# Patient Record
Sex: Male | Born: 1940 | Race: White | Hispanic: No | Smoking: Former smoker
Health system: Southern US, Community
[De-identification: ages and names within clinical notes are randomized; demographics above are authoritative.]

## PROBLEM LIST (undated history)

## (undated) DIAGNOSIS — I2510 Atherosclerotic heart disease of native coronary artery without angina pectoris: Secondary | ICD-10-CM

## (undated) DIAGNOSIS — F0390 Unspecified dementia without behavioral disturbance: Secondary | ICD-10-CM

## (undated) DIAGNOSIS — C33 Malignant neoplasm of trachea: Secondary | ICD-10-CM

## (undated) DIAGNOSIS — I10 Essential (primary) hypertension: Secondary | ICD-10-CM

## (undated) DIAGNOSIS — C801 Malignant (primary) neoplasm, unspecified: Secondary | ICD-10-CM

## (undated) HISTORY — PX: CARDIAC SURGERY: SHX584

## (undated) HISTORY — PX: CORONARY ARTERY BYPASS GRAFT: SHX141

---

## 2013-09-03 ENCOUNTER — Other Ambulatory Visit: Payer: Self-pay

## 2013-09-03 ENCOUNTER — Emergency Department (HOSPITAL_COMMUNITY): Payer: Medicare PPO

## 2013-09-03 ENCOUNTER — Encounter (HOSPITAL_COMMUNITY): Payer: Self-pay | Admitting: Emergency Medicine

## 2013-09-03 ENCOUNTER — Emergency Department (HOSPITAL_COMMUNITY)
Admission: EM | Admit: 2013-09-03 | Discharge: 2013-09-03 | Disposition: A | Discharge status: Home / Self Care | Payer: Medicare PPO | Attending: Emergency Medicine | Admitting: Emergency Medicine

## 2013-09-03 DIAGNOSIS — C33 Malignant neoplasm of trachea: Secondary | ICD-10-CM | POA: Insufficient documentation

## 2013-09-03 DIAGNOSIS — Z87891 Personal history of nicotine dependence: Secondary | ICD-10-CM | POA: Insufficient documentation

## 2013-09-03 DIAGNOSIS — Z7982 Long term (current) use of aspirin: Secondary | ICD-10-CM | POA: Insufficient documentation

## 2013-09-03 DIAGNOSIS — R1310 Dysphagia, unspecified: Secondary | ICD-10-CM | POA: Insufficient documentation

## 2013-09-03 DIAGNOSIS — R0602 Shortness of breath: Secondary | ICD-10-CM | POA: Insufficient documentation

## 2013-09-03 DIAGNOSIS — I2510 Atherosclerotic heart disease of native coronary artery without angina pectoris: Secondary | ICD-10-CM | POA: Insufficient documentation

## 2013-09-03 DIAGNOSIS — Z79899 Other long term (current) drug therapy: Secondary | ICD-10-CM | POA: Insufficient documentation

## 2013-09-03 DIAGNOSIS — I10 Essential (primary) hypertension: Secondary | ICD-10-CM | POA: Insufficient documentation

## 2013-09-03 DIAGNOSIS — I4510 Unspecified right bundle-branch block: Secondary | ICD-10-CM | POA: Insufficient documentation

## 2013-09-03 HISTORY — DX: Malignant neoplasm of trachea: C33

## 2013-09-03 HISTORY — DX: Essential (primary) hypertension: I10

## 2013-09-03 HISTORY — DX: Atherosclerotic heart disease of native coronary artery without angina pectoris: I25.10

## 2013-09-03 HISTORY — DX: Malignant (primary) neoplasm, unspecified: C80.1

## 2013-09-03 LAB — CBC WITH DIFFERENTIAL/PLATELET
Eosinophils Absolute: 0 10*3/uL (ref 0.0–0.7)
Hemoglobin: 12.2 g/dL — ABNORMAL LOW (ref 13.0–17.0)
Lymphocytes Relative: 8 % — ABNORMAL LOW (ref 12–46)
Lymphs Abs: 0.3 10*3/uL — ABNORMAL LOW (ref 0.7–4.0)
MCH: 32.3 pg (ref 26.0–34.0)
Monocytes Relative: 13 % — ABNORMAL HIGH (ref 3–12)
Neutro Abs: 3.1 10*3/uL (ref 1.7–7.7)
Neutrophils Relative %: 79 % — ABNORMAL HIGH (ref 43–77)
Platelets: 124 10*3/uL — ABNORMAL LOW (ref 150–400)
RBC: 3.78 MIL/uL — ABNORMAL LOW (ref 4.22–5.81)
RDW: 12.7 % (ref 11.5–15.5)
WBC: 3.9 10*3/uL — ABNORMAL LOW (ref 4.0–10.5)

## 2013-09-03 LAB — BASIC METABOLIC PANEL
CO2: 25 mEq/L (ref 19–32)
Chloride: 102 mEq/L (ref 96–112)
GFR calc non Af Amer: 89 mL/min — ABNORMAL LOW (ref >90)
Glucose, Bld: 92 mg/dL (ref 70–99)
Potassium: 3.5 mEq/L (ref 3.5–5.1)
Sodium: 137 mEq/L (ref 135–145)

## 2013-09-03 LAB — TROPONIN I
Troponin I: <0.3 ng/mL (ref <0.30)
Troponin I: <0.3 ng/mL (ref <0.30)

## 2013-09-03 MED ORDER — IOHEXOL 350 MG/ML SOLN
80.0000 mL | Freq: Once | INTRAVENOUS | Status: AC | PRN
  Administered 2013-09-03: 80 mL via INTRAVENOUS

## 2013-09-03 NOTE — ED Provider Notes (Signed)
Medical screening examination/treatment/procedure(s) were conducted as a shared visit with non-physician practitioner(s) and myself.  I personally evaluated the patient during the encounter.  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 No chest pain, fever, cough. Able to swallow but has discomfort related to tumor. CTAB, RRR, speaking in full sentences. Tolerating PO but Needs [facility] swallow evaluation.  Start PPI.  EKG Interpretation   None        Glynn Octave, MD 09/03/13 [phone]

## 2013-09-03 NOTE — ED Notes (Addendum)
Pt from home via [facility] with c/o shortness of breath x2 -3 days increasing last night.  Pt is being treated for tracheal cancer, both chemo and radiation at the [place] in [place] starting the month of Dec.  Pt in NAD, A&O.

## 2013-09-03 NOTE — ED Provider Notes (Signed)
Date: 09/03/2013  Rate: 88  Rhythm: normal sinus rhythm  QRS Axis: normal  Intervals: normal  ST/T Wave abnormalities: normal  Conduction Disutrbances:RBBB  Narrative Interpretation:   Old EKG Reviewed: none available    Glynn Octave, MD 09/03/13 [phone]

## 2013-09-03 NOTE — ED Provider Notes (Signed)
CSN: [id number]     Arrival date & time 09/03/13  0845 History   First MD Initiated Contact with Patient 09/03/13 [phone]     Chief Complaint  Patient presents with  . Shortness of Breath   (Consider location/radiation/quality/duration/timing/severity/associated sxs/prior Treatment) HPI  [facility]  PMH: 1. tracheal cancer- doing radiation (Dec 23 last treatment) and chemotherapy 2. Cardiac surgery 3. CAD 4. hypertension 5. dysphagia  Patient presents to the ED with complaints of shortness of breath over the past three days without pain. He says that he has been having difficulties swallowing due to the mass effect on his esophagus but he has never experienced the shortness of breath before. He describes it as difficulty taking in a decent breath. He denies chest pains, headache, weakness, lower extremity swelling, abdominal distention. He has been eating and drinking normally. He is speaking in full sentences. And his vital signs are stable.   Past Medical History  Diagnosis Date  . Cancer   . Tracheal cancer   . Coronary artery disease   . Hypertension    Past Surgical History  Procedure Laterality Date  . Cardiac surgery    . Coronary artery bypass graft     History reviewed. No pertinent family history. History  Substance Use Topics  . Smoking status: Former Smoker    Types: Cigarettes  . Smokeless tobacco: Never Used  . Alcohol Use: 1.2 oz/week    2 Cans of beer per week    Review of Systems The patient denies anorexia, fever, weight loss, vision loss, decreased hearing, hoarseness, chest pain, syncope, , peripheral edema, balance deficits, hemoptysis, abdominal pain, melena, hematochezia, severe indigestion/heartburn, hematuria, incontinence, genital sores, muscle weakness, suspicious skin lesions, transient blindness, difficulty walking, depression, unusual weight change, abnormal bleeding, enlarged lymph nodes, angioedema, and breast  masses.  Allergies  Review of patient's allergies indicates no known allergies.  Home Medications   Current Outpatient Rx  Name  Route  Sig  Dispense  Refill  . aspirin 325 MG EC tablet   Oral   Take 325 mg by mouth daily.         . cholecalciferol (VITAMIN D) 1000 UNITS tablet   Oral   Take 1,000 Units by mouth every morning.         . folic acid (FOLVITE) 400 MCG tablet   Oral   Take 400 mcg by mouth every morning.         . lidocaine (XYLOCAINE) 2 % solution   Mouth/Throat   Use as directed 20 mLs in the mouth or throat daily as needed for mouth pain.         Marland Kitchen lisinopril (PRINIVIL,ZESTRIL) 10 MG tablet   Oral   Take 10 mg by mouth daily.         Marland Kitchen loratadine (CLARITIN) 10 MG tablet   Oral   Take 10 mg by mouth every morning.         . metoprolol (LOPRESSOR) 50 MG tablet   Oral   Take 50 mg by mouth every evening.         . Multiple Vitamin (MULTIVITAMIN WITH MINERALS) TABS tablet   Oral   Take 1 tablet by mouth every morning.         Marland Kitchen oxyCODONE (OXY IR/ROXICODONE) 5 MG immediate release tablet   Oral   Take 5 mg by mouth every 4 (four) hours as needed for severe pain.         Marland Kitchen oxymetazoline (  AFRIN) 0.05 % nasal spray   Each Nare   Place 1 spray into both nostrils 2 (two) times daily.         . probenecid (BENEMID) 500 MG tablet   Oral   Take 500 mg by mouth every morning.         . simvastatin (ZOCOR) 80 MG tablet   Oral   Take 80 mg by mouth every evening.         . sucralfate (CARAFATE) 1 G tablet   Oral   Take 1 g by mouth 4 (four) times daily -  with meals and at bedtime.          BP 139/61  Pulse 72  Temp(Src) 98.1 F (36.7 C) (Oral)  Resp 20  SpO2 98% Physical Exam  Nursing note and vitals reviewed. Constitutional: He appears well-developed and well-nourished. No distress.  HENT:  Head: Normocephalic and atraumatic.  Eyes: Pupils are equal, round, and reactive to light.  Neck: Normal range of motion. Neck  supple.  Cardiovascular: Normal rate and regular rhythm.   Pulmonary/Chest: Effort normal.  Abdominal: Soft.  Neurological: He is alert.  Skin: Skin is warm and dry.    ED Course  Procedures (including critical care time) Labs Review Labs Reviewed  CBC WITH DIFFERENTIAL - Abnormal; Notable for the following:    WBC 3.9 (*)    RBC 3.78 (*)    Hemoglobin 12.2 (*)    HCT 34.5 (*)    Platelets 124 (*)    Neutrophils Relative % 79 (*)    Lymphocytes Relative 8 (*)    Lymphs Abs 0.3 (*)    Monocytes Relative 13 (*)    All other components within normal limits  BASIC METABOLIC PANEL - Abnormal; Notable for the following:    GFR calc non Af Amer 89 (*)    All other components within normal limits  PRO B NATRIURETIC PEPTIDE - Abnormal; Notable for the following:    Pro B Natriuretic peptide (BNP) 139.3 (*)    All other components within normal limits  TROPONIN I  TROPONIN I   Imaging Review Dg Chest 2 View  09/03/2013   CLINICAL DATA:  Shortness of Breath  EXAM: CHEST  2 VIEW  COMPARISON:  None.  FINDINGS: There is mild scarring in the left base. Lungs are otherwise clear. Heart size and pulmonary vascularity are normal. No adenopathy. There is degenerative change in the thoracic spine. Patient is status post coronary artery bypass grafting. There is extensive native coronary artery calcification.  IMPRESSION: Mild scarring left base.  No edema or consolidation.   Electronically Signed   By: Bretta Bang M.D.   On: 09/03/2013 09:56   Ct Angio Chest Pe W/cm &/or Wo Cm  09/03/2013   CLINICAL DATA:  Shortness of Breath ; reported tracheal carcinoma  EXAM: CT ANGIOGRAPHY CHEST WITH CONTRAST  TECHNIQUE: Multidetector CT imaging of the chest was performed using the standard protocol during bolus administration of intravenous contrast. Multiplanar CT image reconstructions including MIPs were obtained to evaluate the vascular anatomy.  CONTRAST:  80mL OMNIPAQUE IOHEXOL 350 MG/ML SOLN   COMPARISON:  Chest radiograph September 03, 2013  FINDINGS: There is no demonstrable pulmonary embolus. There is no thoracic aortic aneurysm or dissection apparent.  There is underlying emphysematous change. There is patchy atelectatic change in both lower lobes. There is mild lower lobe bronchiectatic change. There are multiple peripheral 1-2 mm nodular opacities consistent with a degree of mild underlying nodular  interstitial disease. No larger nodular opacities are appreciated. There is no airspace consolidation.  There is no appreciable thoracic adenopathy. The pericardium is not thickened. Patient is status post coronary artery bypass grafting. There is extensive native coronary artery calcification.  There is diffuse thickening of the wall of the inferior trachea. No well-defined mass is seen in this area. There is extensive thickening throughout most of the esophagus.  In the visualized upper abdomen, there is fatty change in the liver. There is incomplete visualization of a cyst arising from the posterior left kidney measuring 7.6 x 6.6 cm. There is atherosclerotic change in the aorta. There is degenerative change in the thoracic spine. There are no blastic or lytic bone lesions. Thyroid appears normal.  Review of the MIP images confirms the above findings.  IMPRESSION: No demonstrable pulmonary embolus.  There is underlying emphysema with mild nodular interstitial lung disease and lower lobe bronchiectatic change. There is no airspace consolidation.  No adenopathy.  Thickening of the wall of the inferior trachea; this finding may be due to reported radiation therapy in this area. Infiltrative neoplasm in this area cannot be excluded on this study, however.  There is diffuse esophageal thickening. This finding may be due to therapy from known tracheal carcinoma; underlying esophagitis must be of concern given this appearance.   Electronically Signed   By: Bretta Bang M.D.   On: 09/03/2013 11:13     EKG Interpretation   None       MDM   1. Shortness of breath       Date: 09/03/2013  Rate: 88  Rhythm:  sinus rhythm  QRS Axis: right bundle branch block  Intervals: normal  ST/T Wave abnormalities: normal  Conduction Disutrbances:none  Narrative Interpretation:   Old EKG Reviewed: none available   Patient evaluated by Dr. Manus Gunning myself. He has had a very thorough workup in the emergency department which includes a troponin (2), chest x-ray, CT angiography chest, EKG, basic labs. No acute findings are being found. He has maintained his oxygen saturation on room air and has been in no distress during his visit. Dr. Manus Gunning suggested getting a dull to troponin which has come back negative.  [facility] and is [facility] much better. No findings that warrant admission or further evaluation at this time.  72 y.o.Gerome Sam Rosendahl's evaluation in the Emergency Department is complete. It has been determined that no acute conditions requiring further emergency intervention are present at this time. The patient/guardian have been advised of the diagnosis and plan. We have discussed signs and symptoms that warrant return to the ED, such as changes or worsening in symptoms.  Vital signs are stable at discharge. Filed Vitals:   09/03/13 1204  BP: 139/61  Pulse: 72  Temp:   Resp:     Patient/guardian has voiced understanding and agreed to follow-up with the PCP or specialist.        Dorthula Matas, PA-C 09/03/13 1338

## 2014-09-08 DIAGNOSIS — F0390 Unspecified dementia without behavioral disturbance: Secondary | ICD-10-CM

## 2014-09-08 HISTORY — DX: Unspecified dementia, unspecified severity, without behavioral disturbance, psychotic disturbance, mood disturbance, and anxiety: F03.90

## 2018-03-06 ENCOUNTER — Inpatient Hospital Stay (HOSPITAL_COMMUNITY)
Admission: EM | Admit: 2018-03-06 | Discharge: 2018-03-15 | DRG: 871 | Disposition: A | Discharge status: Hospice | Payer: Medicare PPO | Source: Skilled Nursing Facility | Attending: Internal Medicine | Admitting: Internal Medicine

## 2018-03-06 ENCOUNTER — Emergency Department (HOSPITAL_COMMUNITY): Payer: Medicare PPO

## 2018-03-06 ENCOUNTER — Encounter (HOSPITAL_COMMUNITY): Payer: Self-pay | Admitting: Emergency Medicine

## 2018-03-06 ENCOUNTER — Inpatient Hospital Stay (HOSPITAL_COMMUNITY): Payer: Medicare PPO

## 2018-03-06 DIAGNOSIS — R6520 Severe sepsis without septic shock: Secondary | ICD-10-CM | POA: Diagnosis present

## 2018-03-06 DIAGNOSIS — Z923 Personal history of irradiation: Secondary | ICD-10-CM

## 2018-03-06 DIAGNOSIS — N390 Urinary tract infection, site not specified: Secondary | ICD-10-CM | POA: Diagnosis present

## 2018-03-06 DIAGNOSIS — N433 Hydrocele, unspecified: Secondary | ICD-10-CM | POA: Diagnosis present

## 2018-03-06 DIAGNOSIS — A0472 Enterocolitis due to Clostridium difficile, not specified as recurrent: Secondary | ICD-10-CM | POA: Diagnosis present

## 2018-03-06 DIAGNOSIS — Z66 Do not resuscitate: Secondary | ICD-10-CM | POA: Diagnosis present

## 2018-03-06 DIAGNOSIS — G309 Alzheimer's disease, unspecified: Secondary | ICD-10-CM | POA: Diagnosis present

## 2018-03-06 DIAGNOSIS — N139 Obstructive and reflux uropathy, unspecified: Secondary | ICD-10-CM

## 2018-03-06 DIAGNOSIS — A419 Sepsis, unspecified organism: Secondary | ICD-10-CM | POA: Diagnosis present

## 2018-03-06 DIAGNOSIS — I714 Abdominal aortic aneurysm, without rupture: Secondary | ICD-10-CM | POA: Diagnosis present

## 2018-03-06 DIAGNOSIS — R1310 Dysphagia, unspecified: Secondary | ICD-10-CM | POA: Diagnosis present

## 2018-03-06 DIAGNOSIS — G9349 Other encephalopathy: Secondary | ICD-10-CM | POA: Diagnosis present

## 2018-03-06 DIAGNOSIS — S00412A Abrasion of left ear, initial encounter: Secondary | ICD-10-CM | POA: Diagnosis present

## 2018-03-06 DIAGNOSIS — Z79899 Other long term (current) drug therapy: Secondary | ICD-10-CM

## 2018-03-06 DIAGNOSIS — L89152 Pressure ulcer of sacral region, stage 2: Secondary | ICD-10-CM | POA: Diagnosis not present

## 2018-03-06 DIAGNOSIS — R296 Repeated falls: Secondary | ICD-10-CM | POA: Diagnosis present

## 2018-03-06 DIAGNOSIS — H9190 Unspecified hearing loss, unspecified ear: Secondary | ICD-10-CM | POA: Diagnosis present

## 2018-03-06 DIAGNOSIS — Z23 Encounter for immunization: Secondary | ICD-10-CM | POA: Diagnosis present

## 2018-03-06 DIAGNOSIS — A414 Sepsis due to anaerobes: Principal | ICD-10-CM | POA: Diagnosis present

## 2018-03-06 DIAGNOSIS — N138 Other obstructive and reflux uropathy: Secondary | ICD-10-CM | POA: Diagnosis present

## 2018-03-06 DIAGNOSIS — S0003XA Contusion of scalp, initial encounter: Secondary | ICD-10-CM | POA: Diagnosis present

## 2018-03-06 DIAGNOSIS — E43 Unspecified severe protein-calorie malnutrition: Secondary | ICD-10-CM | POA: Diagnosis present

## 2018-03-06 DIAGNOSIS — Z951 Presence of aortocoronary bypass graft: Secondary | ICD-10-CM | POA: Diagnosis not present

## 2018-03-06 DIAGNOSIS — F0281 Dementia in other diseases classified elsewhere with behavioral disturbance: Secondary | ICD-10-CM | POA: Diagnosis present

## 2018-03-06 DIAGNOSIS — E878 Other disorders of electrolyte and fluid balance, not elsewhere classified: Secondary | ICD-10-CM | POA: Diagnosis not present

## 2018-03-06 DIAGNOSIS — L8990 Pressure ulcer of unspecified site, unspecified stage: Secondary | ICD-10-CM

## 2018-03-06 DIAGNOSIS — N2589 Other disorders resulting from impaired renal tubular function: Secondary | ICD-10-CM | POA: Diagnosis not present

## 2018-03-06 DIAGNOSIS — Z87440 Personal history of urinary (tract) infections: Secondary | ICD-10-CM

## 2018-03-06 DIAGNOSIS — I10 Essential (primary) hypertension: Secondary | ICD-10-CM | POA: Diagnosis present

## 2018-03-06 DIAGNOSIS — R338 Other retention of urine: Secondary | ICD-10-CM | POA: Diagnosis not present

## 2018-03-06 DIAGNOSIS — N179 Acute kidney failure, unspecified: Secondary | ICD-10-CM | POA: Diagnosis present

## 2018-03-06 DIAGNOSIS — E8809 Other disorders of plasma-protein metabolism, not elsewhere classified: Secondary | ICD-10-CM | POA: Diagnosis not present

## 2018-03-06 DIAGNOSIS — N320 Bladder-neck obstruction: Secondary | ICD-10-CM | POA: Diagnosis present

## 2018-03-06 DIAGNOSIS — I2510 Atherosclerotic heart disease of native coronary artery without angina pectoris: Secondary | ICD-10-CM | POA: Diagnosis present

## 2018-03-06 DIAGNOSIS — F0391 Unspecified dementia with behavioral disturbance: Secondary | ICD-10-CM | POA: Diagnosis not present

## 2018-03-06 DIAGNOSIS — Z79890 Hormone replacement therapy (postmenopausal): Secondary | ICD-10-CM

## 2018-03-06 DIAGNOSIS — E872 Acidosis: Secondary | ICD-10-CM | POA: Diagnosis present

## 2018-03-06 DIAGNOSIS — M545 Low back pain: Secondary | ICD-10-CM | POA: Diagnosis present

## 2018-03-06 DIAGNOSIS — I4510 Unspecified right bundle-branch block: Secondary | ICD-10-CM | POA: Diagnosis present

## 2018-03-06 DIAGNOSIS — R627 Adult failure to thrive: Secondary | ICD-10-CM | POA: Diagnosis present

## 2018-03-06 DIAGNOSIS — G9340 Encephalopathy, unspecified: Secondary | ICD-10-CM

## 2018-03-06 DIAGNOSIS — Y92009 Unspecified place in unspecified non-institutional (private) residence as the place of occurrence of the external cause: Secondary | ICD-10-CM

## 2018-03-06 DIAGNOSIS — Z9221 Personal history of antineoplastic chemotherapy: Secondary | ICD-10-CM | POA: Diagnosis not present

## 2018-03-06 DIAGNOSIS — N2889 Other specified disorders of kidney and ureter: Secondary | ICD-10-CM | POA: Diagnosis not present

## 2018-03-06 DIAGNOSIS — N289 Disorder of kidney and ureter, unspecified: Secondary | ICD-10-CM | POA: Diagnosis present

## 2018-03-06 DIAGNOSIS — Z8512 Personal history of malignant neoplasm of trachea: Secondary | ICD-10-CM | POA: Diagnosis not present

## 2018-03-06 DIAGNOSIS — K4090 Unilateral inguinal hernia, without obstruction or gangrene, not specified as recurrent: Secondary | ICD-10-CM | POA: Diagnosis present

## 2018-03-06 DIAGNOSIS — N401 Enlarged prostate with lower urinary tract symptoms: Secondary | ICD-10-CM | POA: Diagnosis present

## 2018-03-06 DIAGNOSIS — Z974 Presence of external hearing-aid: Secondary | ICD-10-CM | POA: Diagnosis not present

## 2018-03-06 DIAGNOSIS — R451 Restlessness and agitation: Secondary | ICD-10-CM | POA: Diagnosis not present

## 2018-03-06 DIAGNOSIS — W19XXXA Unspecified fall, initial encounter: Secondary | ICD-10-CM | POA: Diagnosis not present

## 2018-03-06 DIAGNOSIS — I959 Hypotension, unspecified: Secondary | ICD-10-CM | POA: Diagnosis not present

## 2018-03-06 DIAGNOSIS — F05 Delirium due to known physiological condition: Secondary | ICD-10-CM | POA: Diagnosis not present

## 2018-03-06 DIAGNOSIS — N5089 Other specified disorders of the male genital organs: Secondary | ICD-10-CM | POA: Diagnosis present

## 2018-03-06 DIAGNOSIS — D72829 Elevated white blood cell count, unspecified: Secondary | ICD-10-CM | POA: Diagnosis not present

## 2018-03-06 DIAGNOSIS — Z515 Encounter for palliative care: Secondary | ICD-10-CM | POA: Diagnosis not present

## 2018-03-06 DIAGNOSIS — Z681 Body mass index (BMI) 19 or less, adult: Secondary | ICD-10-CM | POA: Diagnosis not present

## 2018-03-06 DIAGNOSIS — Z7982 Long term (current) use of aspirin: Secondary | ICD-10-CM

## 2018-03-06 DIAGNOSIS — L89151 Pressure ulcer of sacral region, stage 1: Secondary | ICD-10-CM | POA: Diagnosis present

## 2018-03-06 DIAGNOSIS — Z87891 Personal history of nicotine dependence: Secondary | ICD-10-CM

## 2018-03-06 DIAGNOSIS — R339 Retention of urine, unspecified: Secondary | ICD-10-CM | POA: Diagnosis present

## 2018-03-06 DIAGNOSIS — Z960 Presence of urogenital implants: Secondary | ICD-10-CM | POA: Diagnosis not present

## 2018-03-06 DIAGNOSIS — F03918 Unspecified dementia, unspecified severity, with other behavioral disturbance: Secondary | ICD-10-CM

## 2018-03-06 HISTORY — DX: Unspecified dementia without behavioral disturbance: F03.90

## 2018-03-06 LAB — URINALYSIS, ROUTINE W REFLEX MICROSCOPIC
BACTERIA UA: NONE SEEN
BILIRUBIN URINE: NEGATIVE
GLUCOSE, UA: NEGATIVE mg/dL
KETONES UR: NEGATIVE mg/dL
LEUKOCYTES UA: NEGATIVE
NITRITE: NEGATIVE
Protein, ur: NEGATIVE mg/dL
Specific Gravity, Urine: 1.02 (ref 1.005–1.030)
pH: 5 (ref 5.0–8.0)

## 2018-03-06 LAB — OSMOLALITY: OSMOLALITY: 288 mosm/kg (ref 275–295)

## 2018-03-06 LAB — I-STAT CG4 LACTIC ACID, ED: Lactic Acid, Venous: 1.81 mmol/L (ref 0.5–1.9)

## 2018-03-06 LAB — PROTIME-INR
INR: 1.12
PROTHROMBIN TIME: 14.4 s (ref 11.4–15.2)

## 2018-03-06 LAB — COMPREHENSIVE METABOLIC PANEL
ALK PHOS: 65 U/L (ref 38–126)
ALT: 14 U/L (ref 0–44)
ANION GAP: 10 (ref 5–15)
AST: 21 U/L (ref 15–41)
Albumin: 1.8 g/dL — ABNORMAL LOW (ref 3.5–5.0)
BILIRUBIN TOTAL: 0.6 mg/dL (ref 0.3–1.2)
BUN: 34 mg/dL — ABNORMAL HIGH (ref 8–23)
CALCIUM: 7.2 mg/dL — AB (ref 8.9–10.3)
CO2: 24 mmol/L (ref 22–32)
Chloride: 99 mmol/L (ref 98–111)
Creatinine, Ser: 1.81 mg/dL — ABNORMAL HIGH (ref 0.61–1.24)
GFR, EST AFRICAN AMERICAN: 39 mL/min — AB (ref >60)
GFR, EST NON AFRICAN AMERICAN: 34 mL/min — AB (ref >60)
GLUCOSE: 104 mg/dL — AB (ref 70–99)
Potassium: 3.7 mmol/L (ref 3.5–5.1)
Sodium: 133 mmol/L — ABNORMAL LOW (ref 135–145)
TOTAL PROTEIN: 4 g/dL — AB (ref 6.5–8.1)

## 2018-03-06 LAB — CBC WITH DIFFERENTIAL/PLATELET
BASOS PCT: 0 %
Band Neutrophils: 0 %
Basophils Absolute: 0 10*3/uL (ref 0.0–0.1)
Blasts: 0 %
EOS ABS: 0 10*3/uL (ref 0.0–0.7)
EOS PCT: 0 %
HEMATOCRIT: 31.8 % — AB (ref 39.0–52.0)
HEMOGLOBIN: 10 g/dL — AB (ref 13.0–17.0)
LYMPHS PCT: 3 %
Lymphs Abs: 1 10*3/uL (ref 0.7–4.0)
MCH: 28.2 pg (ref 26.0–34.0)
MCHC: 31.4 g/dL (ref 30.0–36.0)
MCV: 89.6 fL (ref 78.0–100.0)
Metamyelocytes Relative: 0 %
Monocytes Absolute: 2.2 10*3/uL — ABNORMAL HIGH (ref 0.1–1.0)
Monocytes Relative: 7 %
Myelocytes: 0 %
NEUTROS PCT: 90 %
NRBC: 0 /100{WBCs}
Neutro Abs: 28.6 10*3/uL — ABNORMAL HIGH (ref 1.7–7.7)
Other: 0 %
Platelets: 335 10*3/uL (ref 150–400)
Promyelocytes Relative: 0 %
RBC: 3.55 MIL/uL — ABNORMAL LOW (ref 4.22–5.81)
RDW: 17.6 % — ABNORMAL HIGH (ref 11.5–15.5)
WBC: 31.8 10*3/uL — ABNORMAL HIGH (ref 4.0–10.5)

## 2018-03-06 LAB — I-STAT CHEM 8, ED
BUN: 33 mg/dL — AB (ref 8–23)
CHLORIDE: 95 mmol/L — AB (ref 98–111)
CREATININE: 2 mg/dL — AB (ref 0.61–1.24)
Calcium, Ion: 1.08 mmol/L — ABNORMAL LOW (ref 1.15–1.40)
Glucose, Bld: 117 mg/dL — ABNORMAL HIGH (ref 70–99)
HEMATOCRIT: 31 % — AB (ref 39.0–52.0)
Hemoglobin: 10.5 g/dL — ABNORMAL LOW (ref 13.0–17.0)
Potassium: 3.9 mmol/L (ref 3.5–5.1)
SODIUM: 131 mmol/L — AB (ref 135–145)
TCO2: 23 mmol/L (ref 22–32)

## 2018-03-06 LAB — TSH: TSH: 2.604 u[IU]/mL (ref 0.350–4.500)

## 2018-03-06 LAB — VITAMIN B12: VITAMIN B 12: 5264 pg/mL — AB (ref 180–914)

## 2018-03-06 LAB — APTT: aPTT: 31 seconds (ref 24–36)

## 2018-03-06 LAB — SODIUM, URINE, RANDOM

## 2018-03-06 LAB — FERRITIN: FERRITIN: 109 ng/mL (ref 24–336)

## 2018-03-06 LAB — MRSA PCR SCREENING: MRSA BY PCR: POSITIVE — AB

## 2018-03-06 LAB — OSMOLALITY, URINE: Osmolality, Ur: 512 mOsm/kg (ref 300–900)

## 2018-03-06 MED ORDER — ASPIRIN EC 325 MG PO TBEC: 325.0000 mg | DELAYED_RELEASE_TABLET | Freq: Every day | ORAL | Status: DC

## 2018-03-06 MED ORDER — AZITHROMYCIN 250 MG PO TABS
500.0000 mg | ORAL_TABLET | Freq: Once | ORAL | Status: AC
  Administered 2018-03-06: 500 mg via ORAL
  Filled 2018-03-06: qty 2

## 2018-03-06 MED ORDER — FOLIC ACID 1 MG PO TABS
1.0000 mg | ORAL_TABLET | Freq: Every morning | ORAL | Status: DC
  Administered 2018-03-06 – 2018-03-13 (×8): 1 mg via ORAL
  Filled 2018-03-06 (×9): qty 1

## 2018-03-06 MED ORDER — MUPIROCIN 2 % EX OINT
1.0000 "application " | TOPICAL_OINTMENT | Freq: Two times a day (BID) | CUTANEOUS | Status: AC
  Administered 2018-03-06 – 2018-03-11 (×10): 1 via NASAL
  Filled 2018-03-06 (×5): qty 22

## 2018-03-06 MED ORDER — SODIUM CHLORIDE 0.9 % IV BOLUS (SEPSIS)
1000.0000 mL | Freq: Once | INTRAVENOUS | Status: AC
  Administered 2018-03-06: 1000 mL via INTRAVENOUS

## 2018-03-06 MED ORDER — SODIUM CHLORIDE 0.9 % IV SOLN
2.0000 g | Freq: Once | INTRAVENOUS | Status: AC
  Administered 2018-03-06: 2 g via INTRAVENOUS
  Filled 2018-03-06: qty 2

## 2018-03-06 MED ORDER — CHLORHEXIDINE GLUCONATE CLOTH 2 % EX PADS
6.0000 | MEDICATED_PAD | Freq: Every day | CUTANEOUS | Status: AC
  Administered 2018-03-07 – 2018-03-11 (×4): 6 via TOPICAL

## 2018-03-06 MED ORDER — ENOXAPARIN SODIUM 30 MG/0.3ML ~~LOC~~ SOLN
30.0000 mg | SUBCUTANEOUS | Status: DC
  Administered 2018-03-06 – 2018-03-13 (×8): 30 mg via SUBCUTANEOUS
  Filled 2018-03-06 (×8): qty 0.3

## 2018-03-06 MED ORDER — ACETAMINOPHEN 325 MG PO TABS
650.0000 mg | ORAL_TABLET | Freq: Four times a day (QID) | ORAL | Status: DC | PRN
  Administered 2018-03-08 – 2018-03-14 (×10): 650 mg via ORAL
  Filled 2018-03-06 (×11): qty 2

## 2018-03-06 MED ORDER — ACETAMINOPHEN 650 MG RE SUPP
650.0000 mg | Freq: Four times a day (QID) | RECTAL | Status: DC | PRN
  Administered 2018-03-09: 650 mg via RECTAL

## 2018-03-06 MED ORDER — SODIUM CHLORIDE 0.9 % IV SOLN
1.0000 g | INTRAVENOUS | Status: DC
  Administered 2018-03-07 – 2018-03-11 (×5): 1 g via INTRAVENOUS
  Filled 2018-03-06: qty 10
  Filled 2018-03-06: qty 1
  Filled 2018-03-06 (×4): qty 10

## 2018-03-06 MED ORDER — SODIUM CHLORIDE 0.9 % IV BOLUS
1000.0000 mL | Freq: Once | INTRAVENOUS | Status: AC
  Administered 2018-03-06: 1000 mL via INTRAVENOUS

## 2018-03-06 MED ORDER — LEVOTHYROXINE SODIUM 75 MCG PO TABS
112.5000 ug | ORAL_TABLET | Freq: Every day | ORAL | Status: DC
  Administered 2018-03-07 – 2018-03-13 (×7): 112.5 ug via ORAL
  Filled 2018-03-06 (×8): qty 2

## 2018-03-06 MED ORDER — SODIUM CHLORIDE 0.9 % IV SOLN: 2.0000 g | Freq: Two times a day (BID) | INTRAVENOUS | Status: DC

## 2018-03-06 MED ORDER — CEFEPIME HCL 2 G IJ SOLR: 2.0000 g | Freq: Once | INTRAMUSCULAR | Status: DC

## 2018-03-06 MED ORDER — ASPIRIN EC 81 MG PO TBEC
81.0000 mg | DELAYED_RELEASE_TABLET | Freq: Every day | ORAL | Status: DC
  Administered 2018-03-06 – 2018-03-13 (×8): 81 mg via ORAL
  Filled 2018-03-06 (×9): qty 1

## 2018-03-06 MED ORDER — SODIUM CHLORIDE 0.9 % IV SOLN
1000.0000 mL | INTRAVENOUS | Status: AC
  Administered 2018-03-06 – 2018-03-08 (×3): 1000 mL via INTRAVENOUS

## 2018-03-06 MED ORDER — CEFEPIME HCL 2 G IJ SOLR: 2.0000 g | Freq: Two times a day (BID) | INTRAMUSCULAR | Status: DC

## 2018-03-06 MED ORDER — ATORVASTATIN CALCIUM 40 MG PO TABS
40.0000 mg | ORAL_TABLET | Freq: Every day | ORAL | Status: DC
  Administered 2018-03-06 – 2018-03-13 (×8): 40 mg via ORAL
  Filled 2018-03-06 (×2): qty 1
  Filled 2018-03-06: qty 2
  Filled 2018-03-06 (×5): qty 1

## 2018-03-06 NOTE — H&P (Addendum)
Date: 03/06/2018               Patient Name:  Juan Lowe MRN: [id number]  DOB: 1941/05/25 Age / Sex: 77 y.o., male   PCP: System, Pcp Not In         Medical Service: Internal Medicine Teaching Service         Attending Physician: Dr. Aldine Contes, MD    First Contact: Dr. Shan Levans Pager: [phone]  Second Contact: Dr. Hetty Ely Pager: [phone]       After Hours (After 5p/  First Contact Pager: [phone]  weekends / holidays): Second Contact Pager: [phone]   Chief Complaint: frequent falls, sepsis  History of Present Illness: level 5 caveat pt with advanced dementia majority of history from son and ED provider:  76 yo male with alcohol use disorder, Dementia, HTN, BPH-recent E. Coli sepsis chronic indwelling catheter removed, CAD, tracheal cancer s/p resection and radiation 5 years ago, scrotal hydrocele, frequent falls, AAA.  He has a complicated course that brought him here he was treated at [facility] [place] for E. Coli bacteremia treated with ceftriaxone and cipro finished course, had a chronic indwelling catheter that was removed by his urologist. He has frequent falls last fall May 30th and the patient was encephalopathic which has resolved. The patient recently moved to [place] as his dementia and health problems were progressing and the son wished to have him closer to home.  He comes in after a fall that took place at [place] about 3am this morning.  He was found down with blood in his ear.  His imaging in the ED showed no intracranial bleed or fractures on ct c spine, hip x-rays. During workup he was found to have a white count of 30,000 and hypotensive well below his baseline lactate at 1.8.  He was started on fluids, cefepime and azithromycin.  On our evaluation he was at his mental baseline, hypotensive having some lower back pain from laying in the bed.  The ED nurse attempted to in and out the cath the patient but met resistance and there was a clot.  There was no  sample available for urinalysis.     Meds:  Current Meds  Medication Sig  . aspirin 325 MG EC tablet Take 325 mg by mouth daily.     Allergies: Allergies as of 03/06/2018  . (No Known Allergies)   Past Medical History:  Diagnosis Date  . Cancer ([place])   . Coronary artery disease   . Hypertension   . Tracheal cancer ([place])     Family History: History reviewed. No pertinent family history.  Social History:  Social History   Socioeconomic History  . Marital status: Divorced    Spouse name: Not on file  . Number of children: Not on file  . Years of education: Not on file  . Highest education level: Not on file  Occupational History  . Not on file  Social Needs  . Financial resource strain: Not on file  . Food insecurity:    Worry: Not on file    Inability: Not on file  . Transportation needs:    Medical: Not on file    Non-medical: Not on file  Tobacco Use  . Smoking status: Former Smoker    Types: Cigarettes  . Smokeless tobacco: Never Used  Substance and Sexual Activity  . Alcohol use: Yes    Alcohol/week: 1.2 oz    Types: 2 Cans of beer per week  .  Drug use: No  . Sexual activity: Not on file  Lifestyle  . Physical activity:    Days per week: Not on file    Minutes per session: Not on file  . Stress: Not on file  Relationships  . Social connections:    Talks on phone: Not on file    Gets together: Not on file    Attends religious service: Not on file    Active member of club or organization: Not on file    Attends meetings of clubs or organizations: Not on file    Relationship status: Not on file  . Intimate partner violence:    Fear of current or ex partner: Not on file    Emotionally abused: Not on file    Physically abused: Not on file    Forced sexual activity: Not on file  Other Topics Concern  . Not on file  Social History Narrative  . Not on file  pt was a heavy drinker, last alcoholic beverage was May 28th  Review of Systems: A  complete ROS was negative except as per HPI.   Physical Exam: Blood pressure (!) 103/55, pulse 73, temperature (!) 97.3 F (36.3 C), temperature source Oral, resp. rate 16, height _0  (1.651 m), weight 99 lb (44.9 kg), SpO2 96 %. Physical Exam  Constitutional: He appears well-developed and well-nourished.  HENT:  Nose: Nose normal.  Mouth/Throat: Oropharynx is clear and moist.  Eyes: Right eye exhibits no discharge. Left eye exhibits no discharge. No scleral icterus.  Cardiovascular: Normal rate, regular rhythm and intact distal pulses. Exam reveals distant heart sounds. Exam reveals no gallop and no friction rub.  No murmur heard. Pulmonary/Chest: Effort normal and breath sounds normal. No respiratory distress. He has no wheezes. He has no rales.  Abdominal: Soft. Bowel sounds are normal. He exhibits distension. He exhibits no mass. There is no tenderness. There is no guarding.  Genitourinary:  Genitourinary Comments: Edematous scrotum with some skin breakdown  Neurological: He is alert.  Skin: Skin is warm and dry.  There is a stage 1 sacral pressure ulcer on the patient's sacrum    EKG: personally reviewed my interpretation is sinus rhythm, RBBB  CXR: personally reviewed, some bilateral apical opacification   Assessment & Plan by Problem: Active Problems:   Severe sepsis (HCC)  Sepsis: no definitive source at this time, elevated white count, severe hypotension, AKI, most likely would be urinary given history, not impressed by x-ray as this is a pneumonia.  No urine sample at this time, blood cultures pending  -will continue to work to get a sample for culture and UA -treat empirically with ceftriaxone for now no azithro tomorrow  AKI: baseline creatinine 0.6 just 20 days ago.  Admission creatinine is 2.00 ultrasound showed no hydronephrosis, so far not retaining urine on bladder scan  -could be ATN from hypotension, uncertain how long pt was hypotensive  BPH: seems to be  severe, recently had chronic indwelling catheter complicated by e.coli bacteremia, now removed  -no alpha blocker at this time due to hypotension -[facility] had success with coude catheter, will try this first -can escalate to urology if still unsuccessful  CAD: pt on asa 360m at home and simvastatin  -continue asa and simvastatin  Dispo: Admit patient to Inpatient with expected length of stay greater than 2 midnights.  Signed: WKatherine Roan MD 03/06/2018, 11:52 AM  BVickki MuffMD PGY-1 Internal Medicine Pager # [phone]

## 2018-03-06 NOTE — ED Notes (Signed)
Pt on bedside commode at time of report to floor.

## 2018-03-06 NOTE — Progress Notes (Signed)
88fr. Coude cath inserted without problems. Blood clots noted at meatus prior to insertion. Foreskin replaced after insertion completed. Juan Lowe A

## 2018-03-06 NOTE — ED Notes (Signed)
In and out cath attempted; resistance met and pt in 10/10 pain. Catheter bloody when removed, pt has hx of enlarged prostate. MD aware. Condom cath placed on pt, unable to obtain urine sample at this time. MD and son bedside

## 2018-03-06 NOTE — ED Triage Notes (Addendum)
Pt arrived from [place] at [facility] via [place]. Per EMS pt experienced an unwitnessed fall was found a the base of his bed this afternoon. Initially had pain in left hip/leg but denies pain upon arrival. Per EMS, initial Systolic pressure in the 40Z. 92 systolic on arrival. Pt does have hx of dementia.

## 2018-03-06 NOTE — ED Notes (Signed)
Pt removed his condom catheter while sitting on the commode. Floor RN, Ellard Artis, notified.

## 2018-03-06 NOTE — ED Notes (Signed)
Bladder scanned; 186 ml recorded

## 2018-03-06 NOTE — Progress Notes (Addendum)
Received report on pt.

## 2018-03-06 NOTE — Progress Notes (Signed)
Juan Lowe is a 77 y.o. male patient admitted from ED awake, alert - oriented  X 4 - no acute distress noted.  VSS - Blood pressure (!) 103/55, pulse 73, temperature (!) 97.3 F (36.3 C), temperature source Oral, resp. rate 16, height 5\' 5"  (1.651 m), weight 44.9 kg (99 lb), SpO2 96 %.    IV in place, occlusive dsg intact without redness.  Orientation to room, and floor completed with information packet given to patient/family.  Patient declined safety video at this time.  Admission INP armband ID verified with patient/family, and in place.   SR up x 2, fall assessment complete, with patient and family able to verbalize understanding of risk associated with falls, and verbalized understanding to call nsg before up out of bed.  Call light within reach, patient able to voice, and demonstrate understanding.  Pt has multiple skin tears on his left ear, bilateral arms, back and legs. Pt has a stage 2 pressure injury on his lower back and two stage 1 pressure injuries on his bilateral heels.     Will cont to eval and treat per MD orders.  Celine Ahr, RN 03/06/2018 3:32 PM

## 2018-03-06 NOTE — ED Provider Notes (Signed)
[facility] Provider Note   CSN: [id number] Arrival date & time: 03/06/18  4098     History   Chief Complaint Chief Complaint  Patient presents with  . Fall  . Hypotension    HPI Juan Lowe is a 77 y.o. male.  HPI  Level 5 caveat for severe dementia.  77 y.o. male with PMHx of CAD s/p CABG, Alcoholism, Alzheimer's Dementia, HTN, AAA, Former Smoker, Esophageal Stricture, Tracheal Cancer s/p Chemo/Radiation, known scrotal hydrocele, recent history of large pleural effusion, and recent requirement for of indwelling Foley catheter at [facility] in [place] comes into the ER with chief complaint of fall and low blood pressure.  I called [facility] -and patient was just transferred there yesterday and they were unable to give any meaningful history, except for that patient had an unwitnessed fall and that he was noted to have low blood pressure.  Patient's son arrived to the ER later in his stay.  According to patient's son he brought patient to [facility] yesterday from [place], as his father is cognitively declining and it is becoming difficult to manage his health care from 3 hours away.  He informs me that patient had a recent admission for urosepsis and according to care everywhere, patient had E. coli bacteremia which was treated with ceftriaxone and then Cipro.  Patient is not on any antibiotics at this time.   Past Medical History:  Diagnosis Date  . Cancer ([place])   . Coronary artery disease   . Hypertension   . Tracheal cancer ([place])     There are no active problems to display for this patient.   Past Surgical History:  Procedure Laterality Date  . CARDIAC SURGERY    . CORONARY ARTERY BYPASS GRAFT          Home Medications    Prior to Admission medications   Medication Sig Start Date End Date Taking? Authorizing Provider  aspirin 325 MG EC tablet Take 325 mg by mouth daily.    [provider]  cholecalciferol (VITAMIN D) 1000 UNITS tablet Take 1,000 Units by mouth every morning.    [provider]  folic acid (FOLVITE) 119 MCG tablet Take 400 mcg by mouth every morning.    [provider]  lidocaine (XYLOCAINE) 2 % solution Use as directed 20 mLs in the mouth or throat daily as needed for mouth pain.    [provider]  lisinopril (PRINIVIL,ZESTRIL) 10 MG tablet Take 10 mg by mouth daily.    [provider]  loratadine (CLARITIN) 10 MG tablet Take 10 mg by mouth every morning.    [provider]  metoprolol (LOPRESSOR) 50 MG tablet Take 50 mg by mouth every evening.    [provider]  Multiple Vitamin (MULTIVITAMIN WITH MINERALS) TABS tablet Take 1 tablet by mouth every morning.    [provider]  oxyCODONE (OXY IR/ROXICODONE) 5 MG immediate release tablet Take 5 mg by mouth every 4 (four) hours as needed for severe pain.    [provider]  oxymetazoline (AFRIN) 0.05 % nasal spray Place 1 spray into both nostrils 2 (two) times daily.    [provider]  probenecid (BENEMID) 500 MG tablet Take 500 mg by mouth every morning.    [provider]  simvastatin (ZOCOR) 80 MG tablet Take 80 mg by mouth every evening.    [provider]  sucralfate (CARAFATE) 1 G tablet Take 1 g by mouth 4 (  four) times daily -  with meals and at bedtime.    [provider]    Family History History reviewed. No pertinent family history.  Social History Social History   Tobacco Use  . Smoking status: Former Smoker    Types: Cigarettes  . Smokeless tobacco: Never Used  Substance Use Topics  . Alcohol use: Yes    Alcohol/week: 1.2 oz    Types: 2 Cans of beer per week  . Drug use: No     Allergies   Patient has no known allergies.   Review of Systems Review of Systems  Unable to perform ROS: Dementia     Physical Exam Updated Vital Signs BP (!) 95/53   Pulse 66    Temp (!) 97.3 F (36.3 C) (Oral)   Resp 17   SpO2 98%   Physical Exam  Constitutional: He is oriented to person, place, and time. No distress.  HENT:  Posterior scalp hematoma and left-sided abrasion over the ear  Eyes: Pupils are equal, round, and reactive to light. EOM are normal.  Neck: Neck supple.  Cardiovascular: Normal rate.  Pulmonary/Chest: Effort normal.  Abdominal: Soft.  Musculoskeletal:  Mild tenderness over the left hip otherwise:  Head to toe evaluation shows no hematoma, bleeding of the scalp, no facial abrasions, no spine step offs, crepitus of the chest or neck, no tenderness to palpation of the bilateral upper and lower extremities, no gross deformities, no chest tenderness, no pelvic pain.   Neurological: He is alert and oriented to person, place, and time.  Skin: Skin is warm.  Nursing note and vitals reviewed.    ED Treatments / Results  Labs (all labs ordered are listed, but only abnormal results are displayed) Labs Reviewed  CBC WITH DIFFERENTIAL/PLATELET - Abnormal; Notable for the following components:      Result Value   WBC 31.8 (*)    RBC 3.55 (*)    Hemoglobin 10.0 (*)    HCT 31.8 (*)    RDW 17.6 (*)    All other components within normal limits  I-STAT CHEM 8, ED - Abnormal; Notable for the following components:   Sodium 131 (*)    Chloride 95 (*)    BUN 33 (*)    Creatinine, Ser 2.00 (*)    Glucose, Bld 117 (*)    Calcium, Ion 1.08 (*)    Hemoglobin 10.5 (*)    HCT 31.0 (*)    All other components within normal limits  URINE CULTURE  CULTURE, BLOOD (ROUTINE X 2)  CULTURE, BLOOD (ROUTINE X 2)  URINALYSIS, ROUTINE W REFLEX MICROSCOPIC  PROTIME-INR  APTT  I-STAT CG4 LACTIC ACID, ED    EKG None  Radiology Ct Head Wo Contrast  Result Date: 03/06/2018 CLINICAL DATA:  Fall EXAM: CT HEAD WITHOUT CONTRAST CT CERVICAL SPINE WITHOUT CONTRAST TECHNIQUE: Multidetector CT imaging of the head and cervical spine was performed following the  standard protocol without intravenous contrast. Multiplanar CT image reconstructions of the cervical spine were also generated. COMPARISON:  None. FINDINGS: CT HEAD FINDINGS Brain: There is no mass, hemorrhage or extra-axial collection. There is generalized atrophy without lobar predilection. Old left basal ganglia lacunar infarct. There is hypoattenuation of the periventricular white matter, most commonly indicating chronic ischemic microangiopathy. Vascular: No abnormal hyperdensity of the major intracranial arteries or dural venous sinuses. No intracranial atherosclerosis. Skull: The visualized skull base, calvarium and extracranial soft tissues are normal. Sinuses/Orbits: No fluid levels or advanced mucosal thickening of the visualized  paranasal sinuses. No mastoid or middle ear effusion. The orbits are normal. CT CERVICAL SPINE FINDINGS Alignment: No static subluxation. Facets are aligned. Occipital condyles are normally positioned. Skull base and vertebrae: No acute fracture. Soft tissues and spinal canal: No prevertebral fluid or swelling. No visible canal hematoma. Disc levels: There is multilevel severe facet hypertrophy with bulky anterior osteophytes at C4-C6. No spinal canal stenosis. There is severe neural foraminal stenosis at right C5-6 and left C3-4. Upper chest: No pneumothorax, pulmonary nodule or pleural effusion. Other: Normal visualized paraspinal cervical soft tissues. IMPRESSION: 1. No acute intracranial abnormality. 2. 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 3. No acute fracture or static subluxation of the cervical spine. 4. Multilevel degenerative disc and facet disease with severe neural foraminal stenosis at left C3-4 and right C5-6. Electronically Signed   By: Ulyses Jarred M.D.   On: 03/06/2018 06:12   Ct Cervical Spine Wo Contrast  Result Date: 03/06/2018 CLINICAL DATA:  Fall EXAM: CT HEAD WITHOUT CONTRAST CT CERVICAL SPINE WITHOUT CONTRAST TECHNIQUE:  Multidetector CT imaging of the head and cervical spine was performed following the standard protocol without intravenous contrast. Multiplanar CT image reconstructions of the cervical spine were also generated. COMPARISON:  None. FINDINGS: CT HEAD FINDINGS Brain: There is no mass, hemorrhage or extra-axial collection. There is generalized atrophy without lobar predilection. Old left basal ganglia lacunar infarct. There is hypoattenuation of the periventricular white matter, most commonly indicating chronic ischemic microangiopathy. Vascular: No abnormal hyperdensity of the major intracranial arteries or dural venous sinuses. No intracranial atherosclerosis. Skull: The visualized skull base, calvarium and extracranial soft tissues are normal. Sinuses/Orbits: No fluid levels or advanced mucosal thickening of the visualized paranasal sinuses. No mastoid or middle ear effusion. The orbits are normal. CT CERVICAL SPINE FINDINGS Alignment: No static subluxation. Facets are aligned. Occipital condyles are normally positioned. Skull base and vertebrae: No acute fracture. Soft tissues and spinal canal: No prevertebral fluid or swelling. No visible canal hematoma. Disc levels: There is multilevel severe facet hypertrophy with bulky anterior osteophytes at C4-C6. No spinal canal stenosis. There is severe neural foraminal stenosis at right C5-6 and left C3-4. Upper chest: No pneumothorax, pulmonary nodule or pleural effusion. Other: Normal visualized paraspinal cervical soft tissues. IMPRESSION: 1. No acute intracranial abnormality. 2. 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 3. No acute fracture or static subluxation of the cervical spine. 4. Multilevel degenerative disc and facet disease with severe neural foraminal stenosis at left C3-4 and right C5-6. Electronically Signed   By: Ulyses Jarred M.D.   On: 03/06/2018 06:12   Dg Hip Unilat With Pelvis 2-3 Views Left  Result Date: 03/06/2018 CLINICAL  DATA:  77 year old male with fall and left hip pain. EXAM: DG HIP (WITH OR WITHOUT PELVIS) 2-3V LEFT COMPARISON:  None. FINDINGS: There is no acute fracture or dislocation. The bones are osteopenic. Mild arthritic changes of the hips right more bands than left. The soft tissues appear unremarkable. There is enlarged scrotum which is not well evaluated but may be related to a hydrocele. IMPRESSION: 1. No acute fracture or dislocation.  Osteopenia. 2. Bilateral hip arthritic changes. Electronically Signed   By: Anner Crete M.D.   On: 03/06/2018 04:59    Procedures Procedures (including critical care time) CRITICAL CARE Performed by: Solomon Skowronek   Total critical care time: 49 minutes for severe sepsis  Critical care time was exclusive of separately billable procedures and treating other patients.  Critical care was  necessary to treat or prevent imminent or life-threatening deterioration.  Critical care was time spent personally by me on the following activities: development of treatment plan with patient and/or surrogate as well as nursing, discussions with consultants, evaluation of patient's response to treatment, examination of patient, obtaining history from patient or surrogate, ordering and performing treatments and interventions, ordering and review of laboratory studies, ordering and review of radiographic studies, pulse oximetry and re-evaluation of patient's condition.   Medications Ordered in ED Medications  ceFEPIme (MAXIPIME) 2 g in sodium chloride 0.9 % 100 mL IVPB (has no administration in time range)  sodium chloride 0.9 % bolus 1,000 mL (has no administration in time range)  sodium chloride 0.9 % bolus 1,000 mL (1,000 mLs Intravenous New Bag/Given 03/06/18 0644)     Initial Impression / Assessment and Plan / ED Course  I have reviewed the triage vital signs and the nursing notes.  Pertinent labs & imaging results that were available during my care of the patient were  reviewed by me and considered in my medical decision making (see chart for details).  Clinical Course as of Mar 06 800  Sat Mar 06, 2018  0800 Sepsis reassessment is completed,   [AN]    Clinical Course User Index [AN] Varney Biles, MD    77 year old male comes in with chief complaint of fall and low blood pressure. Patient has multiple medical comorbidities, including CAD, and recent admission for urosepsis and bacteremia at outside hospital.  Patient comes in with an unwitnessed small.  CT scans are negative for any acute findings.  Patient however was noted to be slightly hypotensive.  Labs were ordered and it seems like patient has new AKI and his white count is significantly elevated as well.  Patient is unable to make urine and the RNs were unable to get in and out Foley catheter -however suspicion for urinary source again is high.  We will give him a dose of cefepime.  Given that patient has new endorgan damage and kidneys, he will be treated as severe sepsis.  Lactic acid is normal.  Patient's blood pressure has responded to IV fluids. Stable for admission.  Final Clinical Impressions(s) / ED Diagnoses   Final diagnoses:  Leukocytosis, unspecified type    ED Discharge Orders    None       Varney Biles, MD 03/06/18 [phone]

## 2018-03-06 NOTE — ED Notes (Signed)
Patient transported to CT 

## 2018-03-06 NOTE — Progress Notes (Signed)
Patient is very confused and repeatedly trying to get out of bed.  Patient continuously trying to get out of bed to urinate.  Coude cath was placed but the patient has not understanding of the operation of the catheter and has repeatedly been educated.  Will continue to monitor.

## 2018-03-07 ENCOUNTER — Encounter (HOSPITAL_COMMUNITY): Payer: Self-pay | Admitting: *Deleted

## 2018-03-07 ENCOUNTER — Other Ambulatory Visit: Payer: Self-pay

## 2018-03-07 DIAGNOSIS — F05 Delirium due to known physiological condition: Secondary | ICD-10-CM

## 2018-03-07 DIAGNOSIS — I959 Hypotension, unspecified: Secondary | ICD-10-CM

## 2018-03-07 DIAGNOSIS — N401 Enlarged prostate with lower urinary tract symptoms: Secondary | ICD-10-CM

## 2018-03-07 DIAGNOSIS — N138 Other obstructive and reflux uropathy: Secondary | ICD-10-CM

## 2018-03-07 DIAGNOSIS — N179 Acute kidney failure, unspecified: Secondary | ICD-10-CM

## 2018-03-07 DIAGNOSIS — Z8619 Personal history of other infectious and parasitic diseases: Secondary | ICD-10-CM

## 2018-03-07 DIAGNOSIS — F0391 Unspecified dementia with behavioral disturbance: Secondary | ICD-10-CM

## 2018-03-07 DIAGNOSIS — I2510 Atherosclerotic heart disease of native coronary artery without angina pectoris: Secondary | ICD-10-CM

## 2018-03-07 DIAGNOSIS — Z7982 Long term (current) use of aspirin: Secondary | ICD-10-CM

## 2018-03-07 DIAGNOSIS — N2889 Other specified disorders of kidney and ureter: Secondary | ICD-10-CM

## 2018-03-07 DIAGNOSIS — D72829 Elevated white blood cell count, unspecified: Secondary | ICD-10-CM

## 2018-03-07 DIAGNOSIS — A419 Sepsis, unspecified organism: Secondary | ICD-10-CM

## 2018-03-07 DIAGNOSIS — Z960 Presence of urogenital implants: Secondary | ICD-10-CM

## 2018-03-07 DIAGNOSIS — Z79899 Other long term (current) drug therapy: Secondary | ICD-10-CM

## 2018-03-07 DIAGNOSIS — R338 Other retention of urine: Secondary | ICD-10-CM

## 2018-03-07 LAB — CBC
HEMATOCRIT: 29.3 % — AB (ref 39.0–52.0)
HEMOGLOBIN: 9.2 g/dL — AB (ref 13.0–17.0)
MCH: 28 pg (ref 26.0–34.0)
MCHC: 31.4 g/dL (ref 30.0–36.0)
MCV: 89.1 fL (ref 78.0–100.0)
Platelets: 317 10*3/uL (ref 150–400)
RBC: 3.29 MIL/uL — ABNORMAL LOW (ref 4.22–5.81)
RDW: 17.4 % — AB (ref 11.5–15.5)
WBC: 15.7 10*3/uL — ABNORMAL HIGH (ref 4.0–10.5)

## 2018-03-07 LAB — BASIC METABOLIC PANEL
ANION GAP: 6 (ref 5–15)
BUN: 29 mg/dL — AB (ref 8–23)
CALCIUM: 7.3 mg/dL — AB (ref 8.9–10.3)
CO2: 22 mmol/L (ref 22–32)
Chloride: 107 mmol/L (ref 98–111)
Creatinine, Ser: 1.09 mg/dL (ref 0.61–1.24)
GFR calc Af Amer: >60 mL/min (ref >60)
GFR calc non Af Amer: >60 mL/min (ref >60)
GLUCOSE: 68 mg/dL — AB (ref 70–99)
Potassium: 3.5 mmol/L (ref 3.5–5.1)
SODIUM: 135 mmol/L (ref 135–145)

## 2018-03-07 LAB — GLUCOSE, CAPILLARY: GLUCOSE-CAPILLARY: 76 mg/dL (ref 70–99)

## 2018-03-07 MED ORDER — POLYETHYLENE GLYCOL 3350 17 G PO PACK
17.0000 g | PACK | Freq: Every day | ORAL | Status: DC
  Filled 2018-03-07: qty 1

## 2018-03-07 MED ORDER — HYDROXYZINE HCL 25 MG PO TABS
50.0000 mg | ORAL_TABLET | Freq: Once | ORAL | Status: AC
Start: 2018-03-07 — End: 2018-03-07
  Administered 2018-03-07: 50 mg via ORAL
  Filled 2018-03-07: qty 2

## 2018-03-07 MED ORDER — RAMELTEON 8 MG PO TABS
8.0000 mg | ORAL_TABLET | Freq: Every day | ORAL | Status: DC
  Administered 2018-03-07 (×2): 8 mg via ORAL
  Filled 2018-03-07 (×2): qty 1

## 2018-03-07 MED ORDER — ENSURE ENLIVE PO LIQD
237.0000 mL | Freq: Two times a day (BID) | ORAL | Status: DC
  Administered 2018-03-07 – 2018-03-08 (×3): 237 mL via ORAL

## 2018-03-07 MED ORDER — SODIUM CHLORIDE 0.9 % IV SOLN
INTRAVENOUS | Status: DC
  Administered 2018-03-08 – 2018-03-09 (×2): via INTRAVENOUS
  Administered 2018-03-09: 1000 mL via INTRAVENOUS
  Administered 2018-03-09 – 2018-03-11 (×5): via INTRAVENOUS

## 2018-03-07 MED ORDER — PHENYLEPH-SHARK LIV OIL-MO-PET 0.25-3-14-71.9 % RE OINT
TOPICAL_OINTMENT | Freq: Two times a day (BID) | RECTAL | Status: DC | PRN
  Filled 2018-03-07: qty 28.4

## 2018-03-07 MED ORDER — HYDROXYZINE HCL 25 MG PO TABS
25.0000 mg | ORAL_TABLET | Freq: Once | ORAL | Status: AC
  Administered 2018-03-07: 25 mg via ORAL
  Filled 2018-03-07: qty 1

## 2018-03-07 MED ORDER — POTASSIUM CHLORIDE 20 MEQ/15ML (10%) PO SOLN
40.0000 meq | Freq: Once | ORAL | Status: AC
  Administered 2018-03-07: 40 meq via ORAL
  Filled 2018-03-07: qty 30

## 2018-03-07 MED ORDER — PNEUMOCOCCAL VAC POLYVALENT 25 MCG/0.5ML IJ INJ
0.5000 mL | INJECTION | INTRAMUSCULAR | Status: AC
  Administered 2018-03-08: 0.5 mL via INTRAMUSCULAR
  Filled 2018-03-07: qty 0.5

## 2018-03-07 MED ORDER — HYDROCORTISONE 2.5 % RE CREA
TOPICAL_CREAM | Freq: Two times a day (BID) | RECTAL | Status: DC | PRN
  Administered 2018-03-07: 17:00:00 via RECTAL
  Filled 2018-03-07: qty 28.35

## 2018-03-07 NOTE — Progress Notes (Addendum)
Was paged by nurse stating that the patient was trying to get out of bed to urinate despite having coude cath attached to him.   Went to bedside to examine patient. The patient was confused and felt that he was at someone's house and did not know that he was at hospital being treated for an infection. The patient did not understand that he had a catheter placed that would help him urinate. He was not aggressive, but was not redirectable and kept persisting on getting up to use the bathroom.   The patient has a history of dementia, but he is not on any other medications other than donepezil. It is unknown what patient's baseline is.   Assessment and Plan: The patient is at high risk for delirium due to infection, hx of dementia, and being in a new setting.   -Avoid restraints -Melatonin 8mg  -Hydroxyzine 50mg  once -Delirium precautions  Lars Mage, MD Internal Medicine PGY1 Pager:[phone] 03/07/2018, 12:52 AM

## 2018-03-07 NOTE — Evaluation (Signed)
Clinical/Bedside Swallow Evaluation Patient Details  Name: Juan Lowe MRN: [id number] Date of Birth: [birth year]  Today's Date: 03/07/2018 Time: SLP Start Time (ACUTE ONLY): 1536 SLP Stop Time (ACUTE ONLY): 1543 SLP Time Calculation (min) (ACUTE ONLY): 7 min  Past Medical History:  Past Medical History:  Diagnosis Date  . Cancer ([place])   . Coronary artery disease   . Dementia 2016  . Hypertension   . Tracheal cancer [facility])    Past Surgical History:  Past Surgical History:  Procedure Laterality Date  . CARDIAC SURGERY    . CORONARY ARTERY BYPASS GRAFT     HPI:  Patient is 77 year old male with a past mental history of alcohol use disorder, dementia, hypertension, BPH, recent E. 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 CXR showed Biapical interstitial and nodular airspace opacities. History of GERD, esophagitis with Schatzki ring vs focal peptic stricture. Per MBS at [facility] 02/12/18 pt with severe oropharyngeal dysphagia and silent aspiration of all consistencies; Family decided to encourage PO intake with known risk of aspiration.    Assessment / Plan / Recommendation Clinical Impression  Patient presents with severe aspiration risk in the setting of severe dysphagia (per MBS 02/12/18 at [facility]), altered mental status, hx of esophageal issues, and deconditioning. There is delayed, weak coughing after pt consumes thin liquids, with recruitment of accessory muscles for respiration. Pt declining most POs offered. No family present in the room; I reached pt's son and POA Joseangel by phone. We discussed his father's recent swallow exam and the likelihood that his father will continue to aspirate regardless of consistency given the severity documented in the report. Son expresses desire for his father to "eat and drink if he can" and states that he  wouldn't want his father to have a feeding tube "if he is able to eat." He verbalizes understanding of aspiration risks, including life-threatening aspiration pneumonia. If his father is physically unable to eat, he stated, "I don't know what I should do." SLP educated that in setting of advanced dementia, feeding tubes are not shown to prolong life or provide improved quality of life. I suggested that palliative care team may be helpful in articulating goals of care; son is agreeable. Son wishes to continue current diet (regular, thin liquids), accepting risks for aspiration. SLP will follow briefly for pt/family education.    SLP Visit Diagnosis: Dysphagia, oropharyngeal phase (R13.12)    Aspiration Risk  Severe aspiration risk    Diet Recommendation Regular;Thin liquid(with family accepting risks of aspiration)   Liquid Administration via: Straw;Cup Medication Administration: Crushed with puree Compensations: Minimize environmental distractions;Slow rate;Small sips/bites Postural Changes: Seated upright at 90 degrees    Other  Recommendations Recommended Consults: Other (Comment)(consider palliative consult) Oral Care Recommendations: Oral care before and after PO   Follow up Recommendations Skilled Nursing facility      Frequency and Duration min 1 x/week  1 week       Prognosis Barriers to Reach Goals: Severity of deficits      Swallow Study   General Date of Onset: 03/07/18 HPI: Patient is 77 year old male with a past mental history of alcohol use disorder, dementia, hypertension, BPH, recent E. 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 CXR showed Biapical  interstitial and nodular airspace opacities. History of GERD, esophagitis with Schatzki ring vs focal peptic stricture. Per MBS at [facility] 02/12/18 pt with severe oropharyngeal dysphagia  and silent aspiration of all consistencies; Family decided to encourage PO intake with known risk of aspiration.  Type of Study: Bedside Swallow Evaluation Previous Swallow Assessment: MBS 02/12/18 at [facility] [place]: severely delayed swallow initiation, decreased/severe hyolaryngeal elevation/excursion, absent epiglottic inversion and decreased pharyngeal constriction with backflow noted in to pyriform sinuses; aspiration of all consistencies. Diet Prior to this Study: Regular;Thin liquids Temperature Spikes Noted: No Respiratory Status: Room air History of Recent Intubation: No Behavior/Cognition: Alert;Confused Oral Cavity Assessment: Within Functional Limits Oral Care Completed by SLP: No Oral Cavity - Dentition: Adequate natural dentition Vision: Functional for self-feeding Self-Feeding Abilities: Needs assist;Needs set up Patient Positioning: Upright in bed Baseline Vocal Quality: Normal Volitional Cough: Cognitively unable to elicit Volitional Swallow: Able to elicit    Oral/Motor/Sensory Function Overall Oral Motor/Sensory Function: Within functional limits(for tasks assessed; pt has difficulty following commands)   Ice Chips Ice chips: Not tested   Thin Liquid Thin Liquid: Impaired Presentation: Cup;Self Fed;Straw Oral Phase Functional Implications: Oral holding;Prolonged oral transit Pharyngeal  Phase Impairments: Multiple swallows;Cough - Delayed;Other (comments)(accessory muscle use)    Nectar Thick Nectar Thick Liquid: Not tested   Honey Thick Honey Thick Liquid: Not tested   Puree Puree: Not tested(pt declined)   Solid   GO   Solid: Not tested(patient declined)       Deneise Lever, MS, CCC-SLP Speech-Language Pathologist [phone]  Aliene Altes 03/07/2018,4:20 PM

## 2018-03-07 NOTE — Progress Notes (Signed)
Family stated has not had tetanus in over 10 years

## 2018-03-07 NOTE — Discharge Summary (Signed)
Name: Juan Lowe MRN: [id number] DOB: 1940-12-03 77 y.o. PCP: System, Pcp Not In  Date of Admission: 03/06/2018  3:33 AM Date of Discharge:  Attending Physician: Aldine Contes, MD  Discharge Diagnosis: 1. Sepsis secondary to Clostridium difficile collitis  Discharge Medications: Allergies as of 03/15/2018   No Known Allergies     Medication List    STOP taking these medications   aspirin EC 81 MG tablet   cholecalciferol 1000 units tablet Commonly known as:  VITAMIN D   donepezil 10 MG tablet Commonly known as:  ARICEPT   folic acid 503 MCG tablet Commonly known as:  FOLVITE   levothyroxine 100 MCG tablet Commonly known as:  SYNTHROID, LEVOTHROID   multivitamin with minerals Tabs tablet   simvastatin 20 MG tablet Commonly known as:  ZOCOR   thiamine 100 MG tablet Commonly known as:  VITAMIN B-1   vitamin B-12 1000 MCG tablet Commonly known as:  CYANOCOBALAMIN     TAKE these medications   feeding supplement (ENSURE ENLIVE) Liqd Take 237 mLs by mouth 3 (three) times daily between meals. What changed:  when to take this   finasteride 5 MG tablet Commonly known as:  PROSCAR Take 5 mg by mouth every evening.   hydrocortisone 2.5 % rectal cream Commonly known as:  ANUSOL-HC Place rectally 2 (two) times daily as needed for hemorrhoids.   LORazepam 2 MG/ML injection Commonly known as:  ATIVAN Inject 0.5 mLs (1 mg total) into the vein every 4 (four) hours as needed for anxiety.   metroNIDAZOLE 5-0.79 MG/ML-% IVPB Commonly known as:  FLAGYL Inject 100 mLs (500 mg total) into the vein every 8 (eight) hours for 5 days.   morphine 2 MG/ML injection Inject 1 mL (2 mg total) into the vein every 2 (two) hours as needed.   tamsulosin 0.4 MG Caps capsule Commonly known as:  FLOMAX Take 0.4 mg by mouth every evening.   vancomycin 50 mg/mL oral solution Commonly known as:  VANCOCIN Take 2.5 mLs (125 mg total) by mouth 4 (four) times daily for 14  days.       Disposition and follow-up:   Juan Lowe was discharged from [facility] in Serious condition.  At the hospital follow up visit please address:  1.  - Stage II Sacral Ulcer: Pt has a sacral ulcer with dressings that has been developing with his ongoing diarrhea from C. Diff. He has been on an air mattress bed and needs pressure redistribution  - C. diffile colitis: pt being sent with oral vancomycin & oral metronidazole to try to reduce diarrhea - Dementia: patient has baseline dementia, usually he can answer questions as to how he is feeling but is not oriented. He is very hard of hearing.    2.  Labs / imaging needed at time of follow-up: none  3.  Pending labs/ test needing follow-up: none  Follow-up Appointments: [facility] Course by problem list: [name] is a 77 yo male with history of dementia, BPH with recent E. Coli sepsis with chronic indwelling catheter later removed by his urologist, CAD, tracheal cancer s/p resection & radiation 2014, right scrotal hydrocele with possible right inguinal hernia, dysphagia, esophagitis with esophageal stricture, and HTN. He presented to [facility] with sepsis and AKI incidentally found after coming to the hospital for a fall at home. No bleed or fractures were found on imaging. Sepsis was assumed to be urinary in origin due to previous  infection, but UA was complicated by traumatic cath and resistance, with coude cath later placed. Blood cultures were negative. Pt did show initial improvement on ceftriaxone, his AKI resolved, but he began to show signs of C. Diff colitis. He was started on vancomycin and ultimately IV metronidazole was added after no improvement in symptoms and development of non-anion gap metabolic acidosis due to diarrhea. Fluids were switched from NS to LR.    Pt's course was complicated by agitation and malnutrition secondary to poor PO intake due to AMS and esophageal  history. Family deferred g-tube as they felt patient would not want this. Pt once again showed initial improvement in symptmos and AMS with the dual therapy antibiotics although had continued hypotension and acidosis. On 7/6 pts diarrhea worsened, and his WBC count began to climb. Maintenance of IV lines were difficult to maintain throughout 7/6 and 7/7 due to edema and skin weeping. He showed continued signs of discomfort although determination of source was difficult with AMS and baseline dementia.  Palliative care was consulted 7/7 to discuss goals of care with the family, and it was decided he would be placed on comfort care. Pt is being discharged to [facility] with IV metronidazole and po vancomycin to reduce C. Diff symptoms.   Discharge Vitals:   BP 101/65 (BP Location: Left Arm)   Pulse 93   Temp (!) 97.5 F (36.4 C)   Resp 18   Ht 5\' 5"  (1.651 m)   Wt 84 lb (38.1 kg)   SpO2 (!) 72%   BMI 13.98 kg/m   Pertinent Labs, Studies, and Procedures:    Ref Range & Units 03/08/18 1110  C Diff antigen NEGATIVE POSITIVEAbnormal    C Diff toxin NEGATIVE POSITIVEAbnormal       Discharge Instructions: Discharge Instructions    Diet - low sodium heart healthy   Complete by:  As directed    Increase activity slowly   Complete by:  As directed     Patient being discharged to [facility] with IV metronidazole and po vancomycin for relief of diarrheal symptoms.   SignedMarty Heck, DO 03/15/2018, 11:45 AM   Pager: [phone]

## 2018-03-07 NOTE — Progress Notes (Signed)
   Subjective: pt with some agitation last night due to new surroundings and advanced dementia.  He was very pleasant this am says he feels fine.  We discussed the improvement of his infection.  Objective:  Vital signs in last 24 hours: Vitals:   03/06/18 1606 03/06/18 1700 03/06/18 2131 03/07/18 0530  BP:   (!) 96/55 (!) 91/52  Pulse:   81 64  Resp:   17 11  Temp: 97.8 F (36.6 C)     TempSrc: Oral     SpO2:   100% 98%  Weight:  84 lb (38.1 kg)    Height:       Physical Exam  Constitutional: He appears well-developed and well-nourished.  Mittens on bilateral hands, very pleasant, has wrap around IV site to keep in place.  Eyes: Right eye exhibits no discharge. Left eye exhibits no discharge. No scleral icterus.  Cardiovascular: Normal rate, regular rhythm and intact distal pulses. Exam reveals distant heart sounds. Exam reveals no gallop and no friction rub.  No murmur heard. Pulmonary/Chest: Effort normal and breath sounds normal. No respiratory distress. He has no wheezes. He has no rales.  Abdominal: Soft. Bowel sounds are normal. He exhibits no distension and no mass. There is no tenderness. There is no guarding.  Neurological: He is alert.    Assessment/Plan:  Active Problems:   Severe sepsis (HCC)   Pressure injury of skin  Sepsis: no definitive source at this time, elevated white count, severe hypotension, AKI, most likely would be urinary given history, not impressed by x-ray that this is a pneumonia.  No urine sample before antibiotics were initiated   -ua not impressive but already had abx, urine culture pending -white blood cell count with dramatic improvement, continue ceftriaxone -will continue IV fluid resuscitation for today then switch to maintenance rate tomorrow  Hypotension: initially explained by sepsis, pt supposed to be hypertensive at baseline  -would explore other etiologies if consistently low when IVF discontinued  AKI: baseline creatinine 0.6  just 20 days ago.  Admission creatinine is 2.00 ultrasound showed no hydronephrosis, seemed to have some mild retention yesterday, cause of renal injury could be ATN from hypotension, uncertain how long pt was hypotensive  -resolving nicely with IV fluids and treating infection   BPH: seems to be severe, recently had chronic indwelling catheter complicated by e.coli bacteremia, this was removed by his urologist   -no alpha blocker at this time due to hypotension -coude catheter successful yesterday evening   CAD: pt on asa 325mg  at home and simvastatin   -continue asa and simvastatin  Kidney lesion: hypoechoic lesion in the pole of his left kidney. Lesion also seen on prior imaging before transfer here. This will need to be followed up by urology as an outpatient.     Dispo: Anticipated discharge in approximately 1-2 day(s).   Katherine Roan, MD 03/07/2018, 7:04 AM Vickki Muff MD PGY-1 Internal Medicine Pager # [phone]

## 2018-03-07 NOTE — Plan of Care (Signed)
  Problem: Clinical Measurements: Goal: Ability to maintain clinical measurements within normal limits will improve Outcome: Progressing   Problem: Coping: Goal: Level of anxiety will decrease Outcome: Progressing   Problem: Elimination: Goal: Will not experience complications related to urinary retention Outcome: Progressing

## 2018-03-07 NOTE — Progress Notes (Signed)
Internal Medicine Attending:   I saw and examined the patient. I reviewed the resident's note and I agree with the resident's findings and plan as documented in the resident's note.  Patient with episodes of agitation overnight likely secondary to delirium secondary to his history of dementia and his underlying infection as well as being in new surroundings.  Patient was initially admitted with sepsis of uncertain etiology.  I suspect that he likely had a urinary source of infection even though his UA appears negative at this time as this was collected after he received IV antibiotics.  We will continue with ceftriaxone for now.  Patient's leukocytosis is now improved to 15.  His AKI has now resolved.  Will maintain Foley catheter for now as patient appears to have some urinary retention yesterday and his ultrasound was consistent with bladder outlet obstruction or possible infection. Patient will need to follow-up with his urologist as an outpatient.  No further work-up at this time.

## 2018-03-07 NOTE — Progress Notes (Signed)
Patient continued to try and get up, removed his IV, continued to remove telemetry leads and was not redirectable.  Placed green mittens on patient, new IV placed, will continue to monitor.

## 2018-03-08 DIAGNOSIS — A0472 Enterocolitis due to Clostridium difficile, not specified as recurrent: Secondary | ICD-10-CM

## 2018-03-08 DIAGNOSIS — F0391 Unspecified dementia with behavioral disturbance: Secondary | ICD-10-CM

## 2018-03-08 DIAGNOSIS — N390 Urinary tract infection, site not specified: Secondary | ICD-10-CM

## 2018-03-08 DIAGNOSIS — G9340 Encephalopathy, unspecified: Secondary | ICD-10-CM

## 2018-03-08 DIAGNOSIS — R451 Restlessness and agitation: Secondary | ICD-10-CM

## 2018-03-08 DIAGNOSIS — E43 Unspecified severe protein-calorie malnutrition: Secondary | ICD-10-CM

## 2018-03-08 DIAGNOSIS — H9190 Unspecified hearing loss, unspecified ear: Secondary | ICD-10-CM

## 2018-03-08 DIAGNOSIS — F03918 Unspecified dementia, unspecified severity, with other behavioral disturbance: Secondary | ICD-10-CM

## 2018-03-08 DIAGNOSIS — N179 Acute kidney failure, unspecified: Secondary | ICD-10-CM

## 2018-03-08 DIAGNOSIS — R1310 Dysphagia, unspecified: Secondary | ICD-10-CM

## 2018-03-08 DIAGNOSIS — N139 Obstructive and reflux uropathy, unspecified: Secondary | ICD-10-CM

## 2018-03-08 LAB — BASIC METABOLIC PANEL
ANION GAP: 9 (ref 5–15)
BUN: 16 mg/dL (ref 8–23)
CALCIUM: 7.5 mg/dL — AB (ref 8.9–10.3)
CO2: 22 mmol/L (ref 22–32)
CREATININE: 0.71 mg/dL (ref 0.61–1.24)
Chloride: 108 mmol/L (ref 98–111)
GFR calc Af Amer: >60 mL/min (ref >60)
GLUCOSE: 83 mg/dL (ref 70–99)
Potassium: 3.5 mmol/L (ref 3.5–5.1)
Sodium: 139 mmol/L (ref 135–145)

## 2018-03-08 LAB — MAGNESIUM: Magnesium: 1.6 mg/dL — ABNORMAL LOW (ref 1.7–2.4)

## 2018-03-08 LAB — CBC
HEMATOCRIT: 29.5 % — AB (ref 39.0–52.0)
Hemoglobin: 9 g/dL — ABNORMAL LOW (ref 13.0–17.0)
MCH: 27.9 pg (ref 26.0–34.0)
MCHC: 30.5 g/dL (ref 30.0–36.0)
MCV: 91.3 fL (ref 78.0–100.0)
PLATELETS: 308 10*3/uL (ref 150–400)
RBC: 3.23 MIL/uL — AB (ref 4.22–5.81)
RDW: 17.3 % — ABNORMAL HIGH (ref 11.5–15.5)
WBC: 20.8 10*3/uL — AB (ref 4.0–10.5)

## 2018-03-08 LAB — C DIFFICILE QUICK SCREEN W PCR REFLEX
C DIFFICLE (CDIFF) ANTIGEN: POSITIVE — AB
C Diff interpretation: DETECTED
C Diff toxin: POSITIVE — AB

## 2018-03-08 LAB — PHOSPHORUS: Phosphorus: 1.9 mg/dL — ABNORMAL LOW (ref 2.5–4.6)

## 2018-03-08 MED ORDER — VANCOMYCIN 50 MG/ML ORAL SOLUTION
125.0000 mg | Freq: Four times a day (QID) | ORAL | Status: DC
  Administered 2018-03-08 – 2018-03-15 (×27): 125 mg via ORAL
  Filled 2018-03-08 (×30): qty 2.5

## 2018-03-08 MED ORDER — MAGNESIUM SULFATE 2 GM/50ML IV SOLN
2.0000 g | Freq: Once | INTRAVENOUS | Status: AC
  Administered 2018-03-08: 2 g via INTRAVENOUS
  Filled 2018-03-08: qty 50

## 2018-03-08 MED ORDER — SODIUM CHLORIDE 0.9 % IV BOLUS
500.0000 mL | Freq: Once | INTRAVENOUS | Status: AC
  Administered 2018-03-08: 500 mL via INTRAVENOUS

## 2018-03-08 MED ORDER — ENSURE ENLIVE PO LIQD
237.0000 mL | Freq: Three times a day (TID) | ORAL | Status: DC
  Administered 2018-03-08 – 2018-03-12 (×9): 237 mL via ORAL
  Administered 2018-03-12: 60 mL via ORAL
  Administered 2018-03-13 – 2018-03-15 (×6): 237 mL via ORAL

## 2018-03-08 MED ORDER — POTASSIUM PHOSPHATES 15 MMOLE/5ML IV SOLN
20.0000 meq | Freq: Once | INTRAVENOUS | Status: AC
  Administered 2018-03-08: 20 meq via INTRAVENOUS
  Filled 2018-03-08: qty 4.55

## 2018-03-08 MED ORDER — HALOPERIDOL 0.5 MG PO TABS
0.5000 mg | ORAL_TABLET | Freq: Once | ORAL | Status: AC
  Administered 2018-03-08: 0.5 mg via ORAL
  Filled 2018-03-08: qty 1

## 2018-03-08 MED ORDER — ADULT MULTIVITAMIN W/MINERALS CH
1.0000 | ORAL_TABLET | Freq: Every day | ORAL | Status: DC
  Administered 2018-03-09 – 2018-03-13 (×5): 1 via ORAL
  Filled 2018-03-08 (×6): qty 1

## 2018-03-08 NOTE — Progress Notes (Signed)
Internal Medicine Attending  Date: 03/08/2018  Patient name: [name] [facility] Medical record number: [id number] Date of birth: 05/31/1941 Age: 77 y.o. Gender: male  I saw and evaluated the patient. I reviewed the resident's note by Dr. Sharon Seller and I agree with the resident's findings and plans as documented in her progress note.  When seen on rounds this morning [name] was calm and cooperative although confused. He likely presented with a urinary tract infection complicated by bladder outlet obstruction as the cause of his initial symptoms. He improved with antibiotics and relief of the obstruction via a catheter. Unfortunately, he developed diarrhea and was found to be C. Difficile positive. This is likely the cause of the continued confusion and rebound in his white blood cell count. We are continuing IV antibiotics for the presumed urinary source and will discontinue after 5 days. We are also starting therapy with oral vancomycin for his C. Difficile. We will follow his response clinically.  We will also make sure that we are able to keep up with his postobstructive diuresis with IV fluids to assure continued improvement in his acute kidney injury and hypotension. As an outpatient, his urologist may want to consider finasteride if appropriate.  I anticipate he will require several more days of therapy before being stable for transfer to a skilled nursing facility.

## 2018-03-08 NOTE — Progress Notes (Signed)
Initial Nutrition Assessment  DOCUMENTATION CODES:   Severe malnutrition in context of chronic illness  INTERVENTION:  Ensure Enlive po TID, each supplement provides 350 kcal and 20 grams of protein  Magic cup TID with meals, each supplement provides 290 kcal and 9 grams of protein  MVI  NUTRITION DIAGNOSIS:   Severe Malnutrition related to chronic illness(dementia) as evidenced by severe fat depletion, severe muscle depletion.  GOAL:   Patient will meet greater than or equal to 90% of their needs  MONITOR:   PO intake, Supplement acceptance  REASON FOR ASSESSMENT:   Malnutrition Screening Tool    ASSESSMENT:   Patient with PMH dementia, presented with Sepsis, possible UTI, AKI    Spoke with patient's sister at bedside, patient was unable to provide any history. She does not know much of his history other than he appears to have lost a significant amount of weight and that he was "eating good," has facility. He did not eat much for breakfast this morning, but did consume all of his ensure when it was given to him. Encouraged sister to continue to provide this to him.   1.1L UOP last shift, 7.2L fluid positive so far.  Labs reviewed:  Phos 1.9, Mg 1.6  Medications reviewed and include:  Folic Acid, Miralax  NS at 28mL/hr   NUTRITION - FOCUSED PHYSICAL EXAM:    Most Recent Value  Orbital Region  Severe depletion  Upper Arm Region  Severe depletion  Thoracic and Lumbar Region  Severe depletion  Buccal Region  Severe depletion  Temple Region  Severe depletion  Clavicle Bone Region  Severe depletion  Clavicle and Acromion Bone Region  Severe depletion  Scapular Bone Region  Severe depletion  Dorsal Hand  Severe depletion  Patellar Region  Severe depletion  Anterior Thigh Region  Severe depletion  Posterior Calf Region  Severe depletion       Diet Order:   Diet Order           Diet regular Room service appropriate? Yes; Fluid consistency: Thin  Diet  effective now          EDUCATION NEEDS:   Not appropriate for education at this time  Skin:  Skin Assessment: Skin Integrity Issues: Skin Integrity Issues:: Stage II, Stage I Stage I: to heel Stage II: To lower back  Last BM:  03/08/2018 (Type 7)  Height:   Ht Readings from Last 1 Encounters:  03/06/18 5\' 5"  (1.651 m)    Weight:   Wt Readings from Last 1 Encounters:  03/06/18 84 lb (38.1 kg)    Ideal Body Weight:  61.81 kg  BMI:  Body mass index is 13.98 kg/m.  Estimated Nutritional Needs:   Kcal:  1500-1750 calories  Protein:  65-76 grams (1.7-2g/kg)  Fluid:  UOP + 534mL    Satira Anis. Shabree Tebbetts, MS, RD LDN Inpatient Clinical Dietitian Pager [phone]

## 2018-03-08 NOTE — Progress Notes (Signed)
   Subjective:  Pt pleasant on rounds although non-oriented albeit difficult hearing. He was accompanied at bedside by his sister who is unsure of baseline mental status. He has had increased, watery BM overnight and two more BM this am. He is positive for C. diff. Pt once again having agitation last night. He was given haldol, remelteon, & atrax.  Objective:  Vital signs in last 24 hours: Vitals:   03/07/18 0930 03/07/18 1330 03/07/18 2039 03/08/18 0531  BP: (!) 110/55 (!) 105/54 (!) 104/55   Pulse: 72 65 74   Resp: 17 18 18    Temp:    98 F (36.7 C)  TempSrc:    Oral  SpO2: 100% 98% 100%   Weight:      Height:       Physical Exam  Constitutional: He appears cachectic. He is cooperative. He has a sickly appearance.  Cardiovascular: Normal rate, regular rhythm and normal heart sounds.  Pulmonary/Chest: Breath sounds normal. No respiratory distress. He has no wheezes. He has no rales.  Abdominal: Soft. Bowel sounds are normal. There is tenderness in the suprapubic area and left lower quadrant.  Musculoskeletal:  Mittens covering hands & wrap around IV  Neurological: He is disoriented.  Pleasant, responds to questions but sometimes nonsensical in response  Skin: He is not diaphoretic. There is pallor.     Assessment/Plan:  Active Problems:   Severe sepsis (HCC)   Pressure injury of skin  1. C. Difficile  - began Vancomycin oral solution  2. Sepsis: Unclear source, suspected urinary source due to previous history. Appeared to be resolving yesterday with abx but increased WBC again today (15.7 -> 20.8) likely due to new C. Diff infection   - cont. Ceftriaxone - urine culture pending - will continue maintenance IV .9% NS  3. Hypotension: initially explained by sepsis, possibly due to diarrhea fluid loss  - given NS bolus 500cc  4. AKI: baseline creatinine 0.6 just 20 days ago. Admission creatinine is 2.00 (.71 today) ultrasound showed no hydronephrosis, seemed to have  some mild retention yesterday, cause of renal injury could be ATN from hypotension, uncertain how long pt was hypotensive  - resolving with IV fluids - magnesium sulfate 2g with Mg 1.6  5. BPH: seems to be severe, thickened bladder wall due to outlet obstruction, recently had chronic indwelling catheter complicated by e.coli bacteremia, this was removed by his urologist  -no alpha blocker at this time due to hypotension -coude catheter successful yesterday evening, 330cc out overnight  6. CAD  - cont asa & simvastatin home meds  7. Kidney lesion: hypoechoic lesion superior left pole kidney, seen on previous imaging   - outpt follow up CT or MRI with urology   8. Hypophosphatemia: possibly secondary to poor oral intake and malnutrition  - Potassium phosphate 42mEq  9. Dysphagia: severe aspiration risk per OT with limited oral intake and declining POs. Son unsure what to do but would like to avoid feeding tube  - thin liquid diet  - consider palliative consult in am      Dispo: Anticipated discharge in 4 days.   Meenakshi Sazama A, DO 03/08/2018, 1:48 PM Pager: @[phone] @

## 2018-03-08 NOTE — Progress Notes (Signed)
Pharmacy Antibiotic Note  Juan Lowe is a 77 y.o. male admitted on 03/06/2018 after fall, presented with sepsis.  Pharmacy has been consulted for vancomycin dosing for C diff colitis. C diff antigen and toxin positive. AKI on admit but has resolved. WBC elevated, afeb. He is on ceftriaxone 1 g IV q24h for possible UTI although UA does not look terrible.  Plan: Vancomycin 125 mg PO qid for 10 days Consider discontinuing ceftriaxone Pharmacy signing off, please re-consult if needed  Height: 5\' 5"  (165.1 cm) Weight: 84 lb (38.1 kg) IBW/kg (Calculated) : 61.5  Temp (24hrs), Avg:98 F (36.7 C), Min:98 F (36.7 C), Max:98 F (36.7 C)  Recent Labs  Lab 03/06/18 0639 03/06/18 0649 03/06/18 0654 03/06/18 0958 03/07/18 0500 03/08/18 0625  WBC 31.8*  --   --   --  15.7* 20.8*  CREATININE  --  2.00*  --  1.81* 1.09 0.71  LATICACIDVEN  --   --  1.81  --   --   --     Estimated Creatinine Clearance: 40.3 mL/min (by C-G formula based on SCr of 0.71 mg/dL).    No Known Allergies   Thank you for allowing pharmacy to be a part of this patient's care.  Renold Genta, PharmD, BCPS Clinical Pharmacist Clinical phone for 03/08/2018 until 10p is [phone] Please check AMION for all Pharmacist numbers by unit 03/08/2018 2:22 PM

## 2018-03-08 NOTE — Evaluation (Signed)
Physical Therapy Evaluation Patient Details Name: Juan Lowe MRN: [id number] DOB: 1940-10-18 Today's Date: 03/08/2018   History of Present Illness  Pt is a 77 y/o male who presents s/p fall at SNF. He is admitted with sepsis 2 UTI. PMH significant for tracheal CA, HTN, dementia, CAD.  Clinical Impression  Pt admitted with above diagnosis. Pt currently with functional limitations due to the deficits listed below (see PT Problem List). At the time of PT eval pt was able to perform transfers with up to +2 assist for balance support and safety. Pt very hard of hearing even with hearing aides donned. With increased communication pt may have performed better. Overall is appropriate for continued rehab at the SNF level. Pt will benefit from skilled PT to increase their independence and safety with mobility to allow discharge to the venue listed below.       Follow Up Recommendations SNF;Supervision/Assistance - 24 hour    Equipment Recommendations  None recommended by PT    Recommendations for Other Services       Precautions / Restrictions Precautions Precautions: Fall Restrictions Weight Bearing Restrictions: No      Mobility  Bed Mobility Overal bed mobility: Needs Assistance Bed Mobility: Supine to Sit     Supine to sit: Mod assist     General bed mobility comments: Assist with the pad required for full scoot out to EOB.   Transfers Overall transfer level: Needs assistance Equipment used: 2 person hand held assist Transfers: Sit to/from [facility] Sit to Stand: Mod assist;+2 physical assistance Stand pivot transfers: Mod assist;+2 physical assistance       General transfer comment: VC's for sequencing and safety with transition bed>chair. Pt with increased anxiety upon stand required increased physical assist to initiate pivotal steps.   Ambulation/Gait                Stairs            Wheelchair Mobility    Modified Rankin  (Stroke Patients Only)       Balance Overall balance assessment: Needs assistance Sitting-balance support: Feet supported;No upper extremity supported Sitting balance-Leahy Scale: Fair     Standing balance support: No upper extremity supported;During functional activity Standing balance-Leahy Scale: Poor Standing balance comment: Reliant on UE support during standing activity                             Pertinent Vitals/Pain Pain Assessment: Faces Faces Pain Scale: Hurts little more Pain Location: L arm with movement at times Pain Descriptors / Indicators: Discomfort Pain Intervention(s): Monitored during session    Home Living Family/patient expects to be discharged to:: Skilled nursing facility                      Prior Function Level of Independence: Needs assistance   Gait / Transfers Assistance Needed: Pt's sister present and reports that pt has not been ambulating with a cane or walker at baseline.   ADL's / Homemaking Assistance Needed: Likely requiring some assist for ADL's. Sister reported a caregiver living with the pt prior to his transition to SNF but was unsure what his level of independence was.         Hand Dominance        Extremity/Trunk Assessment   Upper Extremity Assessment Upper Extremity Assessment: Defer to OT evaluation    Lower Extremity Assessment Lower Extremity Assessment: Difficult to assess  due to impaired cognition    Cervical / Trunk Assessment Cervical / Trunk Assessment: Kyphotic  Communication   Communication: HOH  Cognition Arousal/Alertness: Awake/alert Behavior During Therapy: Anxious Overall Cognitive Status: History of cognitive impairments - at baseline                                        General Comments      Exercises     Assessment/Plan    PT Assessment Patient needs continued PT services  PT Problem List Decreased strength;Decreased range of motion;Decreased  activity tolerance;Decreased balance;Decreased mobility;Decreased knowledge of use of DME;Decreased safety awareness;Decreased knowledge of precautions;Pain       PT Treatment Interventions DME instruction;Gait training;Stair training;Functional mobility training;Therapeutic activities;Therapeutic exercise;Neuromuscular re-education;Patient/family education    PT Goals (Current goals can be found in the Care Plan section)  Acute Rehab PT Goals Patient Stated Goal: None stated - take a nap PT Goal Formulation: With patient/family Time For Goal Achievement: 03/22/18 Potential to Achieve Goals: Good    Frequency Min 2X/week   Barriers to discharge        Co-evaluation               AM-PAC PT "6 Clicks" Daily Activity  Outcome Measure Difficulty turning over in bed (including adjusting bedclothes, sheets and blankets)?: Unable Difficulty moving from lying on back to sitting on the side of the bed? : Unable Difficulty sitting down on and standing up from a chair with arms (e.g., wheelchair, bedside commode, etc,.)?: Unable Help needed moving to and from a bed to chair (including a wheelchair)?: A Lot Help needed walking in hospital room?: A Lot Help needed climbing 3-5 steps with a railing? : Total 6 Click Score: 8    End of Session Equipment Utilized During Treatment: Gait belt Activity Tolerance: Patient tolerated treatment well Patient left: in chair;with call bell/phone within reach;with chair alarm set;with nursing/sitter in room;with family/visitor present Nurse Communication: Mobility status PT Visit Diagnosis: Unsteadiness on feet (R26.81);Difficulty in walking, not elsewhere classified (R26.2);History of falling (Z91.81)    Time: 3254-9826 PT Time Calculation (min) (ACUTE ONLY): 31 min   Charges:   PT Evaluation $PT Eval Moderate Complexity: 1 Mod PT Treatments $Gait Training: 8-22 mins   PT G Codes:        Rolinda Roan, PT, DPT Acute Rehabilitation  Services Pager: [phone]   Thelma Comp 03/08/2018, 2:54 PM

## 2018-03-08 NOTE — Plan of Care (Signed)
  Problem: Coping: Goal: Level of anxiety will decrease 03/08/2018 0825 by Irish Lack, RN Outcome: Not Progressing 03/07/2018 2011 by Irish Lack, RN Outcome: Progressing Gave anti anxiety meds last night.   Problem: Nutrition: Goal: Adequate nutrition will be maintained Outcome: Not Progressing   Problem: Clinical Measurements: Goal: Ability to maintain clinical measurements within normal limits will improve Outcome: Progressing   Problem: Elimination: Goal: Will not experience complications related to bowel motility Outcome: Not Progressing Pt has frequent loose stools

## 2018-03-09 DIAGNOSIS — Z974 Presence of external hearing-aid: Secondary | ICD-10-CM

## 2018-03-09 LAB — BASIC METABOLIC PANEL
Anion gap: 9 (ref 5–15)
BUN: 13 mg/dL (ref 8–23)
CALCIUM: 7.6 mg/dL — AB (ref 8.9–10.3)
CO2: 21 mmol/L — ABNORMAL LOW (ref 22–32)
Chloride: 110 mmol/L (ref 98–111)
Creatinine, Ser: 0.71 mg/dL (ref 0.61–1.24)
GFR calc Af Amer: >60 mL/min (ref >60)
GLUCOSE: 111 mg/dL — AB (ref 70–99)
POTASSIUM: 3.6 mmol/L (ref 3.5–5.1)
SODIUM: 140 mmol/L (ref 135–145)

## 2018-03-09 LAB — MAGNESIUM: MAGNESIUM: 2.1 mg/dL (ref 1.7–2.4)

## 2018-03-09 LAB — PHOSPHORUS: PHOSPHORUS: 2.5 mg/dL (ref 2.5–4.6)

## 2018-03-09 NOTE — Progress Notes (Signed)
Subjective: [name] continues to be disoriented. He has difficulty hearing, although hearing aids were in place today. His sister and son were at bedside today. His son stated this has been his baseline for sometime. Pt appears comfortable and states he has some no pain or SOB. Son states he has been somewhat agitated off and on throughout the day and has not been eating.   Son stated he will be going out of town for a few days but has left contact information.  Objective:  Vital signs in last 24 hours: Vitals:   03/09/18 0751 03/09/18 1238 03/09/18 1545 03/09/18 1620  BP: (!) 105/58 (!) 101/50  106/63  Pulse: 87 68  69  Resp: 16 16  20   Temp:  98.5 F (36.9 C) 97.7 F (36.5 C) 97.6 F (36.4 C)  TempSrc:  Axillary Axillary Axillary  SpO2: 95% 97%  93%  Weight:      Height:       Physical Exam  Constitutional: He appears cachectic. He is cooperative but disoriented. He has a sickly appearance.  Cardiovascular: Normal rate, regular rhythm and normal heart sounds.  Pulmonary/Chest: Breath sounds normal. No respiratory distress. He has no wheezes. He has no rales or rhonci Abdominal: Soft. Bowel sounds are normal. Non-distended, NTTP Musculoskeletal:  Mittens covering hands & wrap around IV, extremities are cool, positive UE & pedal pulses  Neurological: disoriented, difficult to determine orientation due to either difficult hearing or disorientation, Pleasant, responds to questions but sometimes nonsensical in response  Skin: He is not diaphoretic. There is pallor    Assessment/Plan:  Active Problems:   Severe sepsis (HCC)   Pressure injury of skin   Protein-calorie malnutrition, severe   Urinary obstruction   AKI (acute kidney injury) ([place])   Enteritis due to Clostridium difficile   Acute encephalopathy   Dementia with behavioral disturbance  1. C. Difficile  - began Vancomycin oral solution - CBC 15.1 --> 20 today, cont. CBC w/diff in am  2. Sepsis: Unclear  source, suspected urinary source due to previous history. Appeared to be resolving yesterday with abx but increased WBC continues to rise, likely due to new C. Diff infection  - cont. Ceftriaxone - urine cx not collected initially due to traumatic cath, reordered today - will continue maintenance IV .9% NS  3. Hypotension: initially explained by sepsis, possibly due to diarrhea fluid loss - inc. Fluids 150cc/hr NS - cont. To monitor   4. AKI: Resolved at this time. baseline creatinine 0.6 just 20 days ago. Admission creatinine is 2.00 (.71 today) ultrasound showed no hydronephrosis,seemed to have some mild retention yesterday, cause of renal injurycould be ATN from hypotension, uncertain how long pt was hypotensive   5. BPH: seems to be severe, thickened bladder wall due to outlet obstruction, recently had chronic indwelling catheter complicated by e.coli bacteremia,this was removed by his urologist  -no alpha blocker at this time due to hypotension -coude catheter placed, o/p 600cc today  6. CAD  - cont asa & simvastatin home meds  7. Kidney lesion: hypoechoic lesion superior left pole kidney, seen on previous imaging  - outpt follow up CT or MRI with urology   8. Hypophosphatemia: resolved. possibly secondary to poor oral intake and malnutrition.   9. Dysphagia: severe aspiration risk per OT with limited oral intake and declining POs.  - discussed feeding tube option with son today. He prefers for his father to cont. To eat what he can and defer the feeding tube  as he believes his father would not want this.     Dispo: Anticipated discharge in approximately 3 day(s).   Brigette Hopfer A, DO 03/09/2018, 6:07 PM Pager: @[phone] @

## 2018-03-09 NOTE — Progress Notes (Signed)
  Speech Language Pathology Treatment: Dysphagia  Patient Details Name: Juan Lowe MRN: [id number] DOB: 1941/03/19 Today's Date: 03/09/2018 Time: 5638-7564 SLP Time Calculation (min) (ACUTE ONLY): 20 min  Assessment / Plan / Recommendation Clinical Impression  Pt consumed straw sips of Ensure with multiple swallows and delayed throat clearing suggestive of known dysphagia and concern for aspiration. Pt has two sisters present who are aware of swallow study completed at outside hospital. They also voice their understanding that the pt's son does not want a feeding tube and wants him to eat/drink by mouth as long as he is able to, accepting the risk of aspiration. They verbalized aspiration precautions with Mod I; however, when offering pt his Ensure, they tried to provide liquids while he was still in a very reclined position. SLP provided repositioning and reinforced the importance of being upright during and after intake. Will continue to follow briefly for additional education/training.   HPI HPI: Patient is 77 year old male with a past mental history of alcohol use disorder, dementia, hypertension, BPH, recent E. 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 CXR showed Biapical interstitial and nodular airspace opacities. History of GERD, esophagitis with Schatzki ring vs focal peptic stricture. Per MBS at [facility] 02/12/18 pt with severe oropharyngeal dysphagia and silent aspiration of all consistencies; Family decided to encourage PO intake with known risk of aspiration.       SLP Plan  Continue with current plan of care       Recommendations  Diet recommendations: (per pt/family wishes) Medication Administration: Crushed with puree Supervision: Staff to assist with self feeding;Full supervision/cueing for compensatory  strategies Compensations: Minimize environmental distractions;Slow rate;Small sips/bites Postural Changes and/or Swallow Maneuvers: Seated upright 90 degrees;Upright 30-60 min after meal                Oral Care Recommendations: Oral care before and after PO Follow up Recommendations: Skilled Nursing facility SLP Visit Diagnosis: Dysphagia, oropharyngeal phase (R13.12) Plan: Continue with current plan of care       GO                Germain Osgood 03/09/2018, 4:45 PM  Germain Osgood, M.A. CCC-SLP [phone]

## 2018-03-09 NOTE — NC FL2 (Signed)
[place] MEDICAID FL2 LEVEL OF CARE SCREENING TOOL     IDENTIFICATION  Patient Name: Juan Lowe Birthdate: 12/11/1940 Sex: male Admission Date (Current Location): 03/06/2018  [facility] and [place] Number:  Herbalist and Address:  [facility], [place] [address], [place], [place] [address]      Provider Number: [id number]  Attending Physician Name and Address:  Aldine Contes, MD  Relative Name and Phone Number:  Robb/son [phone]     Current Level of Care: Hospital Recommended Level of Care: [place] Prior Approval Number:    Date Approved/Denied:   PASRR Number: [id number]  Discharge Plan: SNF    Current Diagnoses: Patient Active Problem List   Diagnosis Date Noted  . Protein-calorie malnutrition, severe 03/08/2018  . Urinary obstruction   . AKI (acute kidney injury) ([place])   . Enteritis due to Clostridium difficile   . Acute encephalopathy   . Dementia with behavioral disturbance   . Severe sepsis ([place]) 03/06/2018  . Pressure injury of skin 03/06/2018    Orientation RESPIRATION BLADDER Height & Weight     Self  Normal Incontinent, Indwelling catheter Weight: 84 lb (38.1 kg) Height:  5\' 5"  (165.1 cm)  BEHAVIORAL SYMPTOMS/MOOD NEUROLOGICAL BOWEL NUTRITION STATUS      Continent(Rectal tube) Diet(See Discharge Summary)  AMBULATORY STATUS COMMUNICATION OF NEEDS Skin   Extensive Assist Verbally PU Stage and Appropriate Care(Stage II on Back; stage I on Heel )                       Personal Care Assistance Level of Assistance  Bathing, Feeding, Dressing Bathing Assistance: Maximum assistance Feeding assistance: Maximum assistance Dressing Assistance: Maximum assistance     Functional Limitations Info  Sight, Hearing, Speech Sight Info: Adequate Hearing Info: Impaired Speech Info: Adequate    SPECIAL CARE FACTORS FREQUENCY  OT (By licensed OT), PT (By licensed PT), Speech therapy      PT Frequency: 5x/wk OT Frequency: 3x/wk     Speech Therapy Frequency: 3x/wk      Contractures Contractures Info: Not present    Additional Factors Info  Code Status, Allergies, Isolation Precautions(DNR) Code Status Info: DNR Allergies Info: NKA     Isolation Precautions Info: c diff, mrsa     Current Medications (03/09/2018):  This is the current hospital active medication list Current Facility-Administered Medications  Medication Dose Route Frequency Provider Last Rate Last Dose  . 0.9 %  sodium chloride infusion   Intravenous Continuous Masoudi, Elhamalsadat, MD 150 mL/hr at 03/09/18 1025    . acetaminophen (TYLENOL) tablet 650 mg  650 mg Oral Q6H PRN Ledell Noss, MD   650 mg at 03/08/18 1743   Or  . acetaminophen (TYLENOL) suppository 650 mg  650 mg Rectal Q6H PRN Ledell Noss, MD      . aspirin EC tablet 81 mg  81 mg Oral Daily Ledell Noss, MD   81 mg at 03/09/18 0934  . atorvastatin (LIPITOR) tablet 40 mg  40 mg Oral q1800 Ledell Noss, MD   40 mg at 03/08/18 1736  . cefTRIAXone (ROCEPHIN) 1 g in sodium chloride 0.9 % 100 mL IVPB  1 g Intravenous Q24H Katherine Roan, MD 200 mL/hr at 03/09/18 0750 1 g at 03/09/18 0750  . Chlorhexidine Gluconate Cloth 2 % PADS 6 each  6 each Topical Q0600 Aldine Contes, MD   6 each at 03/09/18 0607  . enoxaparin (LOVENOX) injection 30 mg  30 mg Subcutaneous Q24H Ledell Noss, MD   30 mg at 03/08/18 2337  . feeding supplement (ENSURE ENLIVE) (ENSURE ENLIVE) liquid 237 mL  237 mL Oral TID BM Aldine Contes, MD   Stopped at 03/09/18 0941  . folic acid (FOLVITE) tablet 1 mg  1 mg Oral q morning - 10a Ledell Noss, MD   1 mg at 03/09/18 0934  . hydrocortisone (ANUSOL-HC) 2.5 % rectal cream   Rectal BID PRN Aldine Contes, MD      . levothyroxine (SYNTHROID, LEVOTHROID) tablet 112.5 mcg  112.5 mcg Oral QAC breakfast Ledell Noss, MD   112.5 mcg at 03/09/18 0746  . multivitamin with minerals tablet 1 tablet  1 tablet Oral Daily Aldine Contes, MD    1 tablet at 03/09/18 0934  . mupirocin ointment (BACTROBAN) 2 % 1 application  1 application Nasal BID Aldine Contes, MD   1 application at 25/63/89 0935  . polyethylene glycol (MIRALAX / GLYCOLAX) packet 17 g  17 g Oral Daily Katherine Roan, MD   Stopped at 03/09/18 [phone]  . vancomycin (VANCOCIN) 50 mg/mL oral solution 125 mg  125 mg Oral QID Alvira Philips, [place]   125 mg at 03/09/18 1329     Discharge Medications: Please see discharge summary for a list of discharge medications.  Relevant Imaging Results:  Relevant Lab Results:   Additional Information SSN [id number]  [name], [place]

## 2018-03-09 NOTE — Clinical Social Work Note (Signed)
Clinical Social Work Assessment  Patient Details  Name: Juan Lowe MRN: [id number] Date of Birth: 06/27/1941  Date of referral:  03/09/18               Reason for consult:  Discharge Planning                Permission sought to share information with:  Facility Sport and exercise psychologist, Family Supports Permission granted to share information::     Name::     Doctor, general practice::  SNFs  Relationship::  son  Contact Information:  [phone]  Housing/Transportation Living arrangements for the past 2 months:  Single Family Home Source of Information:  Adult Children Patient Interpreter Needed:  None Criminal Activity/Legal Involvement Pertinent to Current Situation/Hospitalization:  No - Comment as needed Significant Relationships:  Adult Children, Other Family Members Lives with:  Self Do you feel safe going back to the place where you live?  No Need for family participation in patient care:  Yes (Comment)  Care giving concerns:  CSW received consult for discharge needs. CSW spoke with patient's son regarding PT recommendation of SNF placement at time of discharge. Patient son reports that patient had just been moved to [place] and was only able to stay for one day before he had to some to the hospital. Patient's son states the he was not familiar with any skilled nursing facilities in this area. CSW emailed the patient's son listing of skilled nursing facilities in the area for him to review and decide on placement.   Social Worker assessment / plan:  CSW spoke with patient's son concerning the recommendation of SNF.   Employment status:  Disabled (Comment on whether or not currently receiving Disability) Insurance information:  Medicare PT Recommendations:  [place] / Referral to community resources:  [place]  Patient/Family's Response to care: Patient was from [facility]. Patient;s son recognizes need for rehab and is agreeable  to a SNF in [place].   Patient/Family's Understanding of and Emotional Response to Diagnosis, Current Treatment, and Prognosis:  Patient's son is realistic regarding therapy needs of his father.  Patient's son expressed understanding of CSW role and discharge process as well as medical condition. No questions/concerns about plan or treatment.   Emotional Assessment Appearance:  Appears older than stated age Attitude/Demeanor/Rapport:  Unable to Assess Affect (typically observed):  Unable to Assess Orientation:  Oriented to Self Alcohol / Substance use:  Not Applicable Psych involvement (Current and /or in the community):  No (Comment)  Discharge Needs  Concerns to be addressed:  Care Coordination Readmission within the last 30 days:  No Current discharge risk:  None Barriers to Discharge:  Continued Medical Work up   [employer], [place] 03/09/2018, 2:35 PM

## 2018-03-09 NOTE — Progress Notes (Signed)
Patient bathe, lien change and gown changed. Bleached bed and equipment and hight touch areas.

## 2018-03-09 NOTE — Progress Notes (Signed)
Internal Medicine Attending  Date: 03/09/2018  Patient name: Juan Lowe [facility] Medical record number: [id number] Date of birth: [birth year] Age: 77 y.o. Gender: male  I saw and evaluated the patient. I reviewed the resident's note by Dr. Sharon Seller and I agree with the resident's findings and plans as documented in her progress note.  When rounding on [name] this morning we had a long discussion with his son. We explained that he likely presented with a urinary tract infection as the cause of his initial symptoms. This was treated with antibiotics and catheter drainage with improvement, but he subsequently developed Clostridium difficile colitis with diarrhea. We reviewed the planned course of antibiotics for the urinary tract infection for 5 days and the 10 days that is recommended to treat the Clostridium difficile infection with oral vancomycin. We also discussed the unrelenting and progressive nature of his father's dementia, and that it clinically appears this is continuing to advance to the point that nutrition is becoming a problem. The son recognizes this and seems to understand not much can be done at this time. We therefore discussed general goals of care and he wanted his father to have a life that had quality over quantity. He fully understands the risks associated with eating given his propensity for aspiration, but felt that food was critical to his father's quality of life and therefore was preferred despite the risks. We will continue with the ceftriaxone for 5 days and the oral vancomycin for the C. Difficile for 10 days as per recommendations. He will remain in the hospital for IV hydration to keep up with his postobstructive diuresis on top of C. Difficile colitis with diarrhea. Once he is able to maintain decent oral hydration after resolution of his profuse diarrhea he should be ready for transfer to a skilled nursing facility. I anticipate this will be Friday, July 5 at the earliest.

## 2018-03-10 LAB — CBC WITH DIFFERENTIAL/PLATELET
BASOS PCT: 0 %
Band Neutrophils: 7 %
Basophils Absolute: 0 10*3/uL (ref 0.0–0.1)
Blasts: 0 %
EOS PCT: 0 %
Eosinophils Absolute: 0 10*3/uL (ref 0.0–0.7)
HCT: 30.4 % — ABNORMAL LOW (ref 39.0–52.0)
Hemoglobin: 9.4 g/dL — ABNORMAL LOW (ref 13.0–17.0)
LYMPHS ABS: 0.3 10*3/uL — AB (ref 0.7–4.0)
Lymphocytes Relative: 1 %
MCH: 28.1 pg (ref 26.0–34.0)
MCHC: 30.9 g/dL (ref 30.0–36.0)
MCV: 90.7 fL (ref 78.0–100.0)
METAMYELOCYTES PCT: 0 %
MONO ABS: 2.3 10*3/uL — AB (ref 0.1–1.0)
MONOS PCT: 9 %
MYELOCYTES: 0 %
Neutro Abs: 22.9 10*3/uL — ABNORMAL HIGH (ref 1.7–7.7)
Neutrophils Relative %: 83 %
Other: 0 %
PLATELETS: 294 10*3/uL (ref 150–400)
PROMYELOCYTES RELATIVE: 0 %
RBC: 3.35 MIL/uL — ABNORMAL LOW (ref 4.22–5.81)
RDW: 18.6 % — AB (ref 11.5–15.5)
SMEAR REVIEW: ADEQUATE
WBC: 25.5 10*3/uL — ABNORMAL HIGH (ref 4.0–10.5)
nRBC: 0 /100 WBC

## 2018-03-10 LAB — URINE CULTURE

## 2018-03-10 MED ORDER — METRONIDAZOLE IN NACL 5-0.79 MG/ML-% IV SOLN
500.0000 mg | Freq: Three times a day (TID) | INTRAVENOUS | Status: DC
  Administered 2018-03-10 – 2018-03-12 (×6): 500 mg via INTRAVENOUS
  Filled 2018-03-10 (×6): qty 100

## 2018-03-10 MED ORDER — HALOPERIDOL 0.5 MG PO TABS
0.5000 mg | ORAL_TABLET | Freq: Once | ORAL | Status: AC
  Administered 2018-03-10: 0.5 mg via ORAL
  Filled 2018-03-10: qty 1

## 2018-03-10 NOTE — Progress Notes (Signed)
Paged for agitation. On evaluation patient was alert but not oriented, very talkative, pulling off his gloves and attempting to pull out his lines. Attempted redirection. Discussed delerium precautions with RN. If patient is persistently agitated will give 0.5 mg haldol. Last EKG showed a QTc of 492, will monitor with repeat EKG if haldol is needed.

## 2018-03-10 NOTE — Progress Notes (Signed)
Internal Medicine Attending  Date: 03/10/2018  Patient name: Juan Lowe [facility] Medical record number: [id number] Date of birth: 03/05/1941 Age: 76 y.o. Gender: male  I saw and evaluated the patient. I reviewed the resident's note by Dr. Sharon Seller and I agree with the resident's findings and plans as documented in her progress note.  When seen on rounds this morning [name] was more interactive than the previous 2 days. He continues to have watery diarrhea and his main complaint was pain in the buttock area. On examination he has a stage II sacral ulcer and wound care has assessed and recommended some interventions which we are initiating. We are also continuing to hydrate him, complete a 5 day course of antibiotics for a urinary tract infection which was presumed to be the reason for his admission, and add IV Flagyl to the oral vancomycin given the rising white blood cell count associated with C. Difficile infection. I anticipate he will be requiring inpatient care for at least another 48-72 hours.

## 2018-03-10 NOTE — Consult Note (Signed)
[place] Nurse wound consult note Reason for Consult:sacral stage 3 Wound type:pressure Pressure Injury POA: No Measurement: 1.5cm x 0.2cm x 0.1cm Wound ERX:VQMG Drainage (amount, consistency, odor) none noted Periwound:reddened Dressing procedure/placement/frequency: Patient with recent history of C-diff on prior hospitalization per ED doctor's note. Patient again with C-diff. Surprisingly there is no MASD, patient does have foley and fecal pouch. Pouch intact without leak. Could not see skin under wafer of fecal pouch. Noted Scrotal Hydrocele, present on admission. . I have provided nurses with orders for foam dressing to sacral area, turn side to side only, no lying supine.  Patient's heels are red and boggy which was noted on admission. They have foam dressings present, will add Prevalon Boots to prevent breakdown. Patient's nutritional status being addressed. Patient is very underweight, will order pressure redistribution pad for chair.  Patient high fall risk, he is on a low bed and has mats in place. [place] team will follow weekly, if need assistance prior to next visit please re-consult. Fara Olden, RN-C, WTA-C, [place] Wound Treatment Associate Ostomy Care Associate

## 2018-03-10 NOTE — Progress Notes (Signed)
Subjective: Pt continues to be disoriented but is much more interactive, cooperative and alert this am. He continues to try to remove his mittens. Night team was paged for agitation and pt was given medication to help him rest. His sister is present at bedside this morning helping him eat and states he did not sleep well the night before. Pt states he is having pain and burning in his behind.   Objective:  Vital signs in last 24 hours: Vitals:   03/09/18 1620 03/09/18 2001 03/10/18 0828 03/10/18 2054  BP: 106/63 (!) 103/51    Pulse: 69     Resp: 20 18    Temp: 97.6 F (36.4 C) 98.6 F (37 C) 98.3 F (36.8 C) 98.1 F (36.7 C)  TempSrc: Axillary  Axillary Axillary  SpO2: 93% 97%    Weight:      Height:       Physical Exam Constitutional: He appearscachectic. He iscooperative and alert but disoriented. Difficulty hearing Cardiovascular:Normal rate,regular rhythmand normal heart sounds.  Pulmonary/Chest:Breath sounds normal. Norespiratory distress. He hasno wheezes. He hasno rales or rhonci Abdominal:Soft.Bowel sounds are normal. Non-distended, NTTP Musculoskeletal: Mittens covering hands & wrap around IV, extremities are cool, positive UE & pedal pulses Neurological: disoriented, difficult to determine orientation due to either difficult hearing or disorientation, Pleasant, responds to questions but sometimes nonsensical in response Skin: He isnot diaphoretic.     Assessment/Plan:  Active Problems:   Severe sepsis (HCC)   Pressure injury of skin   Protein-calorie malnutrition, severe   Urinary obstruction   AKI (acute kidney injury) ([place])   Enteritis due to Clostridium difficile   Acute encephalopathy   Dementia with behavioral disturbance  #. C. Difficile: CBC continues to rise 20 --> 25 today - am CBC - pt started on metronidazole - began Vancomycin oral solution   #. Sepsis:Unclear source, suspected urinary source due to previous history.  Appeared to be resolving yesterday with abx but increased WBC continues to rise, likely due to new C. Diff infection  - cont. Ceftriaxone 3 days - urine cx not collected initially due to traumatic cath, reordered yesterday and awaiting results - will continue maintenance IV .9% NS  #Stage II sacral ulcer - wound care consulted, foam dressing ordered, turn side to side and will keep from lying supine  #. Hypotension: initially explained by sepsis, possibly due to diarrhea fluid loss - inc. Fluids 150cc/hr NS - cont. To monitor   #. GYB:WLSLHTDS at this time. baseline creatinine 0.6 just 20 days ago. Admission creatinine is 2.00 (.71 yesterday)ultrasound showed no hydronephrosis,seemed to have some mild retention yesterday, cause of renal injurycould be ATN from hypotension, uncertain how long pt was hypotensive - am BMP  5. KAJ:GOTLX to be severe, thickened bladder wall due to outlet obstruction, recently had chronic indwelling catheter complicated by e.coli bacteremia,this was removed by his urologist  -no alpha blocker at this time due to hypotension -coude catheter placed, o/p 600cc today  6. CAD - cont asa & simvastatin home meds  7. Kidney lesion: hypoechoic lesion superior left pole kidney, seen on previous imaging  - outpt follow up CT or MRI with urology   8. Hypophosphatemia: resolved. possibly secondary to poor oral intake and malnutrition.   9. Dysphagia: severe aspiration risk per OT with limited oral intake and declining POs. Discussed feeding tube option with son. He prefers for his father to cont. To eat what he can and defer the feeding tube as he believes his father would  not want this.    Dispo: Anticipated discharge in approximately 2 days.   Molli Hazard A, DO 03/10/2018, 8:56 PM Pager: [phone]

## 2018-03-11 DIAGNOSIS — N2589 Other disorders resulting from impaired renal tubular function: Secondary | ICD-10-CM

## 2018-03-11 LAB — COMPREHENSIVE METABOLIC PANEL
ALT: 18 U/L (ref 0–44)
AST: 31 U/L (ref 15–41)
Albumin: 1.5 g/dL — ABNORMAL LOW (ref 3.5–5.0)
Alkaline Phosphatase: 92 U/L (ref 38–126)
Anion gap: 6 (ref 5–15)
BUN: 17 mg/dL (ref 8–23)
CO2: 20 mmol/L — AB (ref 22–32)
CREATININE: 0.77 mg/dL (ref 0.61–1.24)
Calcium: 7.5 mg/dL — ABNORMAL LOW (ref 8.9–10.3)
Chloride: 117 mmol/L — ABNORMAL HIGH (ref 98–111)
GFR calc non Af Amer: >60 mL/min (ref >60)
Glucose, Bld: 134 mg/dL — ABNORMAL HIGH (ref 70–99)
POTASSIUM: 3.8 mmol/L (ref 3.5–5.1)
SODIUM: 143 mmol/L (ref 135–145)
Total Bilirubin: 0.5 mg/dL (ref 0.3–1.2)
Total Protein: 3.6 g/dL — ABNORMAL LOW (ref 6.5–8.1)

## 2018-03-11 LAB — CBC
HCT: 33.7 % — ABNORMAL LOW (ref 39.0–52.0)
Hemoglobin: 10 g/dL — ABNORMAL LOW (ref 13.0–17.0)
MCH: 27.5 pg (ref 26.0–34.0)
MCHC: 29.7 g/dL — ABNORMAL LOW (ref 30.0–36.0)
MCV: 92.6 fL (ref 78.0–100.0)
PLATELETS: 266 10*3/uL (ref 150–400)
RBC: 3.64 MIL/uL — AB (ref 4.22–5.81)
RDW: 18.7 % — AB (ref 11.5–15.5)
WBC: 16.1 10*3/uL — AB (ref 4.0–10.5)

## 2018-03-11 LAB — CULTURE, BLOOD (ROUTINE X 2)
Culture: NO GROWTH
Culture: NO GROWTH
Special Requests: ADEQUATE

## 2018-03-11 LAB — BASIC METABOLIC PANEL
Anion gap: 4 — ABNORMAL LOW (ref 5–15)
BUN: 19 mg/dL (ref 8–23)
CO2: 16 mmol/L — ABNORMAL LOW (ref 22–32)
CREATININE: 0.74 mg/dL (ref 0.61–1.24)
Calcium: 7.2 mg/dL — ABNORMAL LOW (ref 8.9–10.3)
Chloride: 119 mmol/L — ABNORMAL HIGH (ref 98–111)
GFR calc non Af Amer: >60 mL/min (ref >60)
Glucose, Bld: 100 mg/dL — ABNORMAL HIGH (ref 70–99)
Potassium: 5.3 mmol/L — ABNORMAL HIGH (ref 3.5–5.1)
SODIUM: 139 mmol/L (ref 135–145)

## 2018-03-11 LAB — PHOSPHORUS: PHOSPHORUS: 2.4 mg/dL — AB (ref 2.5–4.6)

## 2018-03-11 LAB — MAGNESIUM: MAGNESIUM: 2 mg/dL (ref 1.7–2.4)

## 2018-03-11 MED ORDER — LACTATED RINGERS IV SOLN
INTRAVENOUS | Status: DC
  Administered 2018-03-11 (×2): 1000 mL via INTRAVENOUS
  Administered 2018-03-12 – 2018-03-14 (×3): via INTRAVENOUS

## 2018-03-11 MED ORDER — SODIUM POLYSTYRENE SULFONATE 15 GM/60ML PO SUSP
15.0000 g | Freq: Once | ORAL | Status: AC
  Administered 2018-03-11: 15 g via ORAL
  Filled 2018-03-11: qty 60

## 2018-03-11 NOTE — Progress Notes (Signed)
Subjective: Mr. Pollitt cooperative and pleasant today although continues to try to remove gloves, has difficulty hearing, and is disoriented with baseline dementia. He states that he is feeling well today except for requesting cold water and continued pain in his behind. He denies abdominal pain.   Objective:  Vital signs in last 24 hours: Vitals:   03/11/18 0320 03/11/18 0329 03/11/18 0834 03/11/18 1515  BP:  107/74 105/67 111/69  Pulse: 86   (!) 105  Resp: 19 20 16 20   Temp:  97.6 F (36.4 C) 98.4 F (36.9 C) 98.7 F (37.1 C)  TempSrc:  Oral Axillary Axillary  SpO2: 99%  98%   Weight:      Height:       Physical Exam Constitutional: He appearscachectic. He iscooperative and alertbut disoriented. Pt has difficulty hearing Cardiovascular:Normal rate,regular rhythmand normal heart sounds.  Pulmonary/Chest:Breath sounds normal. Norespiratory distress. He hasno wheezes. He hasno ralesor rhonci Abdominal:Soft.Bowel sounds are normal.Non-distended, NTTP Musculoskeletal: Mittens covering hands & wrap present around IV, extremities are cool, positive UE & pedal pulses Neurological:disoriented, difficult to determine orientation due to difficult hearing and baseline dementia,Pleasant, responds to questions but sometimes nonsensical in response Skin: He isnot diaphoretic or pale    Assessment/Plan:  Active Problems:   Severe sepsis (HCC)   Pressure injury of skin   Protein-calorie malnutrition, severe   Urinary obstruction   AKI (acute kidney injury) ([place])   Enteritis due to Clostridium difficile   Acute encephalopathy   Dementia with behavioral disturbance  #. C. Difficile: WBC lower today 16 <-- 25 yesterday - am CBC - cont. Metronidazole and Vancomycin oral solution  #. Sepsis:Unclear source, initially suspected urinary source due to previous history. Initial urine cx was not collected due to traumatic cath. WBC improving with medications, and  patient possible was admitted with C. Diff due to previous antibiotic treatment several weeks ago.  - d/c Ceftriaxone   #Non-anion gap acidosis: patient switched to LR 151ml/hr from NS with improvement in electrolyte imbalances on afternoon labs - cont. LR 19mL hr  #Malnutrition: severe aspiration risk per OT with limited oral intake and declining POs. Discussed feeding tube option with son. He prefers for his father to cont. To eat what he can and defer the feeding tube as he believes his father would not want this.  - hypocalcemia likely due to hypoalbuminemia  - hypophosphatemia resolved, 2.4 today - nutrition consulted - BMP tomorrow  #Stage II sacral ulcer - wound care consulted, foam dressing ordered, turn side to side and will keep from lying supine  #. Hypotension: resolved, now on 165mL/hr LR - cont. To monitor  #. LMR:AJHHIDUP at this time.baseline creatinine 0.6 just 20 days ago. Admission creatinine is 2.00,ultrasound showed no hydronephrosis,seemed to have some mild retention yesterday, cause of renal injurycould be ATN from hypotension, uncertain how long pt was hypotensive  5. BDH:DIXBO to be severe, thickened bladder wall due to outlet obstruction, recently had chronic indwelling catheter complicated by e.coli bacteremia,this was removed by his urologist -no alpha blocker at this time due to hypotension -coude catheterplaced, o/p 1200cc overnight  6. CAD - cont asa & simvastatin home meds  7. Kidney lesion: hypoechoic lesion superior left pole kidney, seen on previous imaging  - outpt follow up CT or MRI with urology    Dispo: Anticipated discharge in approximately 1 day. Social work spoke with son who has chosen [facility] for [facility].   Nara Paternoster A, DO 03/11/2018, 5:17 PM Pager: [phone]

## 2018-03-11 NOTE — Progress Notes (Signed)
CSW spoke with pt son and confirmed choice for [facility] when stable for DC (anticipate this weekend if medically ready) patient has authorization from insurance  For 7/5-7/7 # [id number] for [facility]  CSW will continue to follow  Jorge Ny, [place] Social Worker [phone]

## 2018-03-11 NOTE — Progress Notes (Signed)
Internal Medicine Attending  Date: 03/11/2018  Patient name: Juan Lowe [facility] Medical record number: [id number] Date of birth: [birth year] Age: 77 y.o. Gender: male  I saw and evaluated the patient. I reviewed the resident's note by Dr. Sharon Seller and I agree with the resident's findings and plans as documented in her progress note.  When seen on rounds this morning [name] was still confused, but much more cooperative and interactive than the day prior. Stool output over the last 24 hours was recorded at 1000 mL. He has developed a hyperchloremic non-anion gap metabolic acidosis and this is likely a combination of both the normal saline he has been receiving as well as the diarrhea he has been experiencing. We have switched his solution to lactated Ringer's continuing to try to meet his stool output as well as insensible losses. As we have been thinking through the case we believe that the initial infection was unlikely to be urinary at this point and we have stopped the ceftriaxone. We believe that he came in with C. Difficile colitis and that was the cause of his infectious symptoms as well as his leukocytosis. Fortunately, with the vancomycin and IV Flagyl the leukocytosis is improving and we hope to see a marked decrease in the stool output over the next 24-48 hours. He has been accepted into a nursing home and our goal is to transfer once ready. He no longer requires stepdown status.

## 2018-03-11 NOTE — Progress Notes (Signed)
Patient was transferred to Med-Surg level of care, per MD verbal order. Will continue to monitor.

## 2018-03-11 NOTE — Progress Notes (Signed)
Physical Therapy Treatment Patient Details Name: Juan Lowe MRN: [id number] DOB: 30-Nov-1940 Today's Date: 03/11/2018    History of Present Illness Pt is a 77 y/o male who presents s/p fall at SNF. He is admitted with sepsis 2 UTI. PMH significant for tracheal CA, HTN, dementia, CAD.    PT Comments    Pt willing to work with PT, however requires redirect for following one step commands secondary to perseveration on removal of mitts. Due to pt's increased activity RN requested back to bed after therapy instead of up in chair. Pt mod A for sitting EoB and able to perform 2x sit<>stand with modAx2 before requesting to go back to bed and laying down requiring minA for management of LE into bed. D/c plans remain appropriate at this time. PT will continue to follow acutely for work on strength and progression to ambulation.     Follow Up Recommendations  SNF;Supervision/Assistance - 24 hour     Equipment Recommendations  None recommended by PT    Recommendations for Other Services       Precautions / Restrictions Precautions Precautions: Fall Restrictions Weight Bearing Restrictions: No    Mobility  Bed Mobility Overal bed mobility: Needs Assistance Bed Mobility: Supine to Sit;Sit to Supine     Supine to sit: Mod assist Sit to supine: Min assist   General bed mobility comments: modA for pad scoot to EoB, min A for management of LE back into bed   Transfers Overall transfer level: Needs assistance Equipment used: 2 person hand held assist Transfers: Sit to/from [facility] Sit to Stand: Mod assist;+2 physical assistance         General transfer comment: modAx 2 for power up into standing, maximal verbal cuing and redirect to accomplish 2x sit>stand after 2 time of standing pt request laying back in bed and laid trunk down in bed         Balance Overall balance assessment: Needs assistance Sitting-balance support: Feet supported;No upper  extremity supported Sitting balance-Leahy Scale: Fair     Standing balance support: No upper extremity supported;During functional activity Standing balance-Leahy Scale: Poor Standing balance comment: Reliant on UE support during standing activity                            Cognition Arousal/Alertness: Awake/alert Behavior During Therapy: Anxious Overall Cognitive Status: History of cognitive impairments - at baseline                                 General Comments: perseverating on removal of mittens         General Comments General comments (skin integrity, edema, etc.): Sister present during session and reports pt is more confused than baseline. Pt on 3L O2 via nasal cannula with SaO2 of 95%O2, during mobility unable to attain SaO2 information through finger or ear, pt with no signs physical signs of oxygen desaturation       Pertinent Vitals/Pain Pain Assessment: Faces Pain Score: 0-No pain    Home Living Family/patient expects to be discharged to:: Skilled nursing facility                    Prior Function Level of Independence: Needs assistance  Gait / Transfers Assistance Needed: Pt's sister present and reports that pt has not been ambulating with a cane or walker at baseline.  ADL's / Homemaking  Assistance Needed: Likely requiring some assist for ADL's. Sister reported a caregiver living with the pt prior to his transition to SNF but was unsure what his level of independence was.      PT Goals (current goals can now be found in the care plan section) Acute Rehab PT Goals Patient Stated Goal: None stated - take a nap PT Goal Formulation: With patient/family Time For Goal Achievement: 03/22/18 Potential to Achieve Goals: Good Progress towards PT goals: Progressing toward goals    Frequency    Min 2X/week      PT Plan Current plan remains appropriate       AM-PAC PT "6 Clicks" Daily Activity  Outcome Measure  Difficulty  turning over in bed (including adjusting bedclothes, sheets and blankets)?: Unable Difficulty moving from lying on back to sitting on the side of the bed? : Unable Difficulty sitting down on and standing up from a chair with arms (e.g., wheelchair, bedside commode, etc,.)?: Unable Help needed moving to and from a bed to chair (including a wheelchair)?: A Lot Help needed walking in hospital room?: A Lot Help needed climbing 3-5 steps with a railing? : Total 6 Click Score: 8    End of Session Equipment Utilized During Treatment: Gait belt Activity Tolerance: Patient tolerated treatment well Patient left: in chair;with call bell/phone within reach;with chair alarm set;with nursing/sitter in room;with family/visitor present Nurse Communication: Mobility status PT Visit Diagnosis: Unsteadiness on feet (R26.81);Difficulty in walking, not elsewhere classified (R26.2);History of falling (Z91.81)     Time: 9604-5409 PT Time Calculation (min) (ACUTE ONLY): 33 min  Charges:  $Therapeutic Activity: 23-37 mins                    G Codes:       Jonuel Butterfield B. Migdalia Dk PT, DPT Acute Rehabilitation  [phone] Pager [phone]     [place] 03/11/2018, 10:52 AM

## 2018-03-12 DIAGNOSIS — A0472 Enterocolitis due to Clostridium difficile, not specified as recurrent: Secondary | ICD-10-CM

## 2018-03-12 DIAGNOSIS — E872 Acidosis: Secondary | ICD-10-CM

## 2018-03-12 DIAGNOSIS — E43 Unspecified severe protein-calorie malnutrition: Secondary | ICD-10-CM

## 2018-03-12 DIAGNOSIS — E8809 Other disorders of plasma-protein metabolism, not elsewhere classified: Secondary | ICD-10-CM

## 2018-03-12 DIAGNOSIS — L89152 Pressure ulcer of sacral region, stage 2: Secondary | ICD-10-CM

## 2018-03-12 LAB — CBC
HCT: 34.3 % — ABNORMAL LOW (ref 39.0–52.0)
Hemoglobin: 10.2 g/dL — ABNORMAL LOW (ref 13.0–17.0)
MCH: 27.3 pg (ref 26.0–34.0)
MCHC: 29.7 g/dL — AB (ref 30.0–36.0)
MCV: 91.7 fL (ref 78.0–100.0)
PLATELETS: 232 10*3/uL (ref 150–400)
RBC: 3.74 MIL/uL — AB (ref 4.22–5.81)
RDW: 18.5 % — ABNORMAL HIGH (ref 11.5–15.5)
WBC: 13.7 10*3/uL — ABNORMAL HIGH (ref 4.0–10.5)

## 2018-03-12 LAB — COMPREHENSIVE METABOLIC PANEL
ALT: 16 U/L (ref 0–44)
AST: 33 U/L (ref 15–41)
Albumin: 1.5 g/dL — ABNORMAL LOW (ref 3.5–5.0)
Alkaline Phosphatase: 101 U/L (ref 38–126)
Anion gap: 6 (ref 5–15)
BUN: 16 mg/dL (ref 8–23)
CALCIUM: 7.5 mg/dL — AB (ref 8.9–10.3)
CHLORIDE: 119 mmol/L — AB (ref 98–111)
CO2: 19 mmol/L — ABNORMAL LOW (ref 22–32)
CREATININE: 0.76 mg/dL (ref 0.61–1.24)
GFR calc non Af Amer: >60 mL/min (ref >60)
Glucose, Bld: 110 mg/dL — ABNORMAL HIGH (ref 70–99)
Potassium: 3.8 mmol/L (ref 3.5–5.1)
Sodium: 144 mmol/L (ref 135–145)
Total Bilirubin: 0.7 mg/dL (ref 0.3–1.2)
Total Protein: 3.7 g/dL — ABNORMAL LOW (ref 6.5–8.1)

## 2018-03-12 MED ORDER — METRONIDAZOLE 500 MG PO TABS
500.0000 mg | ORAL_TABLET | Freq: Three times a day (TID) | ORAL | Status: DC
  Administered 2018-03-12 – 2018-03-14 (×7): 500 mg via ORAL
  Filled 2018-03-12 (×7): qty 1

## 2018-03-12 MED ORDER — TAMSULOSIN HCL 0.4 MG PO CAPS
0.4000 mg | ORAL_CAPSULE | Freq: Every day | ORAL | Status: DC
  Administered 2018-03-13: 0.4 mg via ORAL
  Filled 2018-03-12 (×2): qty 1

## 2018-03-12 NOTE — Progress Notes (Signed)
Clinical Social Worker spoke with [facility] to verify that patient will be bale to discharge to facility this weekend. CSW spoke with [facility] and she stated facility will be able to take patient over the weekend. CSW also verified with patients insurance company that his authorization will be active until Sunday July 7th at 5 pm. Patient will need to be in the facility before authorization lapse.   Rhea Pink, MSW,  [place]

## 2018-03-12 NOTE — Progress Notes (Signed)
Internal Medicine Attending  Date: 03/12/2018  Patient name: Juan Lowe Date of birth: Feb 15, 1941 Age: 77 y.o. Gender: male  I saw and evaluated the patient. I reviewed the resident's note by Dr. Sharon Seller and I agree with the resident's findings and plans as documented in her progress note.  When seen on rounds this morning Juan Lowe was interactive although remained disoriented. The stool output decreased from 1000 mL over 24 hours to 700 mL over 24 hours. We are continuing the intravenous fluids to match his stool output, urine output, and insensible losses. We are hopeful that the oral vancomycin and IV Flagyl will continue to decrease his stool output so that he will be stable for transfer to [facility] skilled nursing facility later this weekend.

## 2018-03-12 NOTE — Progress Notes (Signed)
Subjective: Mr. Rout is accompanied by his sister at bedside today. His communication is somewhat improved as he is answering most questions but is still disoriented. He states he has no pain, but that he believes he is in bad shape. He denies abdominal pain, SOB, or chest pain.   Objective:  Vital signs in last 24 hours: Vitals:   03/11/18 0834 03/11/18 1515 03/11/18 2141 03/12/18 0511  BP: 105/67 111/69 124/80 (!) 123/55  Pulse:  (!) 105 (!) 103 85  Resp: 16 20 18 15   Temp: 98.4 F (36.9 C) 98.7 F (37.1 C) 98 F (36.7 C) 97.7 F (36.5 C)  TempSrc: Axillary Axillary Oral Oral  SpO2: 98%  98% 100%  Weight:      Height:       Physical Exam Constitutional: He appearscachectic. Unhappy with condition. Alert, disoriented.Pt has difficulty hearing Cardiovascular: regular rate and rhythm, + pedal pulses, UE pulses, UE warm, LE cool Pulmonary/Chest:Breath sounds normal. Norespiratory distress. He hasno wheezes. He hasno ralesor rhonci Abdominal:Soft.Bowel sounds are normal.Non-distended, He is tender to palpation Musculoskeletal:extremities are cool, positive UE & pedal pulses Neurological:conversational but some disorientation, difficult to determine orientation due to difficult hearing and baseline dementia, positive for weakness Skin: He isnot diaphoretic or pale    Assessment/Plan:  Active Problems:   Severe sepsis (HCC)   Pressure injury of skin   Protein-calorie malnutrition, severe   Urinary obstruction   AKI (acute kidney injury) ([place])   Enteritis due to Clostridium difficile   Acute encephalopathy   Dementia with behavioral disturbance  #. C. Difficile:WBC lower today 13 <-- 15 yesterday - Metronidazole - Vancomycin oral solution - 14 days total as previous c.diff infection per son  #. Sepsis:Unclear source, initially suspected urinary source due to previous history. Initial urine cx was not collected due to traumatic cath. WBC improving with  medications, and patient possible was admitted with C. Diff due to previous antibiotic treatment several weeks ago.  - d/c Ceftriaxone07/04  #Non-anion gap acidosis: patient switched to LR 153ml/hr from NS with improvement in electrolyte imbalances on afternoon labs - cont. LR 122mL hr - am BMP  #Malnutrition: severe aspiration risk per OT with limited oral intake and declining POs.Discussed feeding tube option with son. He prefers for his father to cont. To eat what he can and defer the feeding tube as he believes his father would not want this.  - hypocalcemia likely due to hypoalbuminemia  - hypophosphatemia resolved - nutrition consulted - BMP tomorrow  #Stage II sacral ulcer - wound care consulted, foam dressing ordered, turn side to side and will keep from lying supine  #. Hypotension: resolved, now on 169mL/hr LR - cont. To monitor  #. EXH:BZJIRCVE at this time.baseline creatinine 0.6 just 20 days ago. Admission creatinine is 2.00,ultrasound showed no hydronephrosis,seemed to have some mild retention yesterday, cause of renal injurycould be ATN from hypotension, uncertain how long pt was hypotensive  5. LFY:BOFBP to be severe, thickened bladder wall due to outlet obstruction, recently had chronic indwelling catheter complicated by e.coli bacteremia,this was removed by his urologist - start tamsulosin .4mg   -coude catheterplaced, o/p 1200cc overnight  6. CAD - cont asa & simvastatin home meds  7. Kidney lesion: hypoechoic lesion superior left pole kidney, seen on previous imaging  - outpt follow up CT or MRI with urology     Dispo: Anticipated discharge in approximately 2 days. Will be discharged to [facility].   Jamorris Ndiaye A, DO 03/12/2018, 3:13 PM Pager: [phone]

## 2018-03-12 NOTE — Progress Notes (Signed)
  Speech Language Pathology Treatment: Dysphagia  Patient Details Name: Juan Lowe MRN: [id number] DOB: [birth year] Today's Date: 03/12/2018 Time: 9163-8466 SLP Time Calculation (min) (ACUTE ONLY): 8 min  Assessment / Plan / Recommendation Clinical Impression  Pt receiving meds with RN upon arrival with daughter, sister and ex-wife at bedside. He had been adequately positioned upright via RN and consumed crushed meds then straw sips water. No overt s/s however pt has history of silent aspiration during MBS 02/2018 and would not expect to see/detect impairments. Sister and daughter reiterated plan for comfort feeds and therapist reviewed general precautions.Continue regular texture choosing foods most appropriate for pt. Discharge ST.    HPI HPI: Patient is 77 year old male with a past mental history of alcohol use disorder, dementia, hypertension, BPH, recent E. 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 CXR showed Biapical interstitial and nodular airspace opacities. History of GERD, esophagitis with Schatzki ring vs focal peptic stricture. Per MBS at [facility] 02/12/18 pt with severe oropharyngeal dysphagia and silent aspiration of all consistencies; Family decided to encourage PO intake with known risk of aspiration.       SLP Plan  All goals met;Discharge SLP treatment due to (comment)       Recommendations  Diet recommendations: Regular;Thin liquid Liquids provided via: Straw;Cup Medication Administration: Crushed with puree Supervision: Staff to assist with self feeding;Full supervision/cueing for compensatory strategies Compensations: Minimize environmental distractions;Slow rate;Small sips/bites Postural Changes and/or Swallow Maneuvers: Seated upright 90 degrees;Upright 30-60 min after meal                Oral Care  Recommendations: Oral care BID Follow up Recommendations: Skilled Nursing facility SLP Visit Diagnosis: Dysphagia, oropharyngeal phase (R13.12) Plan: All goals met;Discharge SLP treatment due to (comment)       GO                [place] Siren 03/12/2018, 3:19 PM  Orbie Pyo Colvin Caroli.Ed [employer] [phone]

## 2018-03-13 DIAGNOSIS — M79671 Pain in right foot: Secondary | ICD-10-CM

## 2018-03-13 DIAGNOSIS — M79672 Pain in left foot: Secondary | ICD-10-CM

## 2018-03-13 LAB — CBC
HEMATOCRIT: 37.8 % — AB (ref 39.0–52.0)
HEMOGLOBIN: 11.3 g/dL — AB (ref 13.0–17.0)
MCH: 27.6 pg (ref 26.0–34.0)
MCHC: 29.9 g/dL — AB (ref 30.0–36.0)
MCV: 92.4 fL (ref 78.0–100.0)
Platelets: 165 10*3/uL (ref 150–400)
RBC: 4.09 MIL/uL — ABNORMAL LOW (ref 4.22–5.81)
RDW: 18.9 % — ABNORMAL HIGH (ref 11.5–15.5)
WBC: 15.2 10*3/uL — ABNORMAL HIGH (ref 4.0–10.5)

## 2018-03-13 LAB — BASIC METABOLIC PANEL
ANION GAP: 9 (ref 5–15)
BUN: 21 mg/dL (ref 8–23)
CALCIUM: 8 mg/dL — AB (ref 8.9–10.3)
CO2: 18 mmol/L — AB (ref 22–32)
Chloride: 117 mmol/L — ABNORMAL HIGH (ref 98–111)
Creatinine, Ser: 1.12 mg/dL (ref 0.61–1.24)
GFR calc Af Amer: >60 mL/min (ref >60)
GFR calc non Af Amer: >60 mL/min (ref >60)
GLUCOSE: 115 mg/dL — AB (ref 70–99)
Potassium: 4.2 mmol/L (ref 3.5–5.1)
Sodium: 144 mmol/L (ref 135–145)

## 2018-03-13 NOTE — Progress Notes (Addendum)
Nutrition Follow-up / Consult  DOCUMENTATION CODES:   Severe malnutrition in context of chronic illness  INTERVENTION:    Continue Magic cup TID with meals, each supplement provides 290 kcal and 9 grams of protein  Continue Ensure Enlive po TID, each supplement provides 350 kcal and 20 grams of protein  NUTRITION DIAGNOSIS:   Severe Malnutrition related to chronic illness(dementia) as evidenced by severe fat depletion, severe muscle depletion.  Ongoing  GOAL:   Patient will meet greater than or equal to 90% of their needs  Unmet   MONITOR:   PO intake, Supplement acceptance  REASON FOR ASSESSMENT:   Consult Assessment of nutrition requirement/status  ASSESSMENT:   Patient with PMH dementia, presented with Sepsis, possible UTI, AKI   Received MD consult for assessment of nutrition status. Please see initial nutrition assessment on 7/1 for details.   Patient / family does not want to pursue a G-tube at this time. PO intake remains inadequate. He is receiving a regular diet with thin liquids, consuming 10-25% of meals. Per SLP notes, plans are for comfort feedings. Per RN, patient takes a few sips of Ensure sometimes.    Diet Order:   Diet Order           Diet regular Room service appropriate? Yes; Fluid consistency: Thin  Diet effective now          EDUCATION NEEDS:   Not appropriate for education at this time  Skin:  Skin Assessment: Skin Integrity Issues: Skin Integrity Issues:: Stage II, Stage I Stage I: to both heels Stage II: To lower back  Last BM:  7/5 (rectal tube)  Height:   Ht Readings from Last 1 Encounters:  03/06/18 5\' 5"  (1.651 m)    Weight:   Wt Readings from Last 1 Encounters:  03/06/18 84 lb (38.1 kg)    Ideal Body Weight:  61.81 kg  BMI:  Body mass index is 13.98 kg/m.  Estimated Nutritional Needs:   Kcal:  1500-1750 calories  Protein:  65-76 grams (1.7-2g/kg)  Fluid:  1.5-1.7 L    Molli Barrows, RD, LDN,  [place] Pager [phone] After Hours Pager [phone]

## 2018-03-13 NOTE — Progress Notes (Signed)
   Subjective: Patient was interactive but remained disoriented.  Asking for cold water. Complaining of pain in his feet.  Denies any abdominal pain.  Objective:  Vital signs in last 24 hours: Vitals:   03/12/18 0511 03/12/18 1647 03/12/18 2330 03/13/18 0556  BP: (!) 123/55 113/70 105/67 (!) 91/59  Pulse: 85 95 86 91  Resp: 15 17 18 18   Temp: 97.7 F (36.5 C) (!) 97.5 F (36.4 C) 97.6 F (36.4 C) 97.7 F (36.5 C)  TempSrc: Oral Oral Oral Oral  SpO2: 100% 100% 94% 95%  Weight:      Height:       General.  Well-developed, hard of hearing ,chronically ill-appearing elderly man, in no acute distress. Lungs.  Normal work of breathing, clear bilaterally. CV.  Regular rate and rhythm. Abdomen.  Soft, nontender, bowel sounds positive. Extremities.  Trace lower extremity edema bilaterally, pink bandages on the heel to prevent pressure sores.  Assessment/Plan:  C. difficile colitis.  No stool output recorded for yesterday, 350 since this morning. White cell count little fluctuating, at 15.2 today. Clinically appears stable, unchanged from yesterday. -Continue Flagyl-can be disc continued if goes to SNF tomorrow. -Continue p.o. Vancomycin-he will need a tapering dose. -Continue LR at 125 mL/h for insensible losses, patient do not have free access to water and appears thirsty whenever he was seen on rounds. -Encouraged p.o. Intake.  Sepsis: Initially thought to be UTI, UA was not very impressive.  Most likely appear septic secondary to C. Difficile, as patient got multiple courses of antibiotics due to recurrent UTI.  Got ceftriaxone for 5 days.  Hypotension.  Blood pressure little soft this morning.  It improved with IV fluid resuscitation before.  No IV fluid going up and seen this morning.  There was no order of discontinuation. -Continue LR 125 mL/h. -Continue to monitor.  Hyperchloremic, non-anion gap metabolic acidosis.  Patient continued to have hyperchloremic non-anion gap  metabolic acidosis with bicarb at 18 and chloride 117. -Continue LR. -Repeat BMP tomorrow morning.  Stage II sacral ulcer.  Patient on air mattress to prevent further decubitus ulceration. -Continue foam dressing and turning patient regularly.  AKI: Resolved.  WOE:HOZYYQ BPH as he developed obstruction after removal of chronic indwelling catheter by his urologist. -Continue Coude catheter. -Continue tamsulosin.  CAD.  Denies any chest pain. -Continue aspirin and simvastatin.  Kidney lesion: Hypoechoic lesion superior left pole kidney, seen on previous imaging  - outpt follow up CT or MRI with urology  Malnutrition: Patient appears malnourished with severe hypoalbuminemia. Nutrition was consulted-we appreciate their recommendations.  Dispo: Anticipated discharge in approximately 1-2 day(s).   Lorella Nimrod, MD 03/13/2018, 1:49 PM Pager: [phone]

## 2018-03-13 NOTE — Progress Notes (Signed)
Internal Medicine Attending  Date: 03/13/2018  Patient name: Juan Lowe [facility] Medical record number: [id number] Date of birth: Aug 29, 1941 Age: 77 y.o. Gender: male  I saw and evaluated the patient. I reviewed the resident's note by Dr. Reesa Chew and I agree with the resident's findings and plans as documented in her progress note.  When seen on rounds this morning [name] was clinically unchanged. He remained thirsty and asked for water. Unfortunately, for unclear reasons, his intravenous fluids were not hanging when we entered the room. He likely has become slightly dehydrated once again with his continued diarrhea and insensible losses not being covered by the IV fluids that had been ordered. Also, unfortunately, the stool output was not recorded yesterday so we are unclear if we are making progress with the oral vancomycin and IV Flagyl. It appears the stool is now being measured once again and should give [place] an idea over the next 24 hours as to the progress we have been making. I am concerned that with these minor setbacks we are unlikely to make the mandated 5 PM by Sunday transfer to the skilled nursing facility. I hope I am wrong and we will reassess his clinical situation tomorrow while continuing the IV fluids and measuring the stool output.

## 2018-03-14 DIAGNOSIS — N433 Hydrocele, unspecified: Secondary | ICD-10-CM

## 2018-03-14 DIAGNOSIS — K4090 Unilateral inguinal hernia, without obstruction or gangrene, not specified as recurrent: Secondary | ICD-10-CM

## 2018-03-14 DIAGNOSIS — G9340 Encephalopathy, unspecified: Secondary | ICD-10-CM

## 2018-03-14 DIAGNOSIS — Z515 Encounter for palliative care: Secondary | ICD-10-CM

## 2018-03-14 DIAGNOSIS — E878 Other disorders of electrolyte and fluid balance, not elsewhere classified: Secondary | ICD-10-CM

## 2018-03-14 LAB — CBC
HCT: 35.3 % — ABNORMAL LOW (ref 39.0–52.0)
Hemoglobin: 10.6 g/dL — ABNORMAL LOW (ref 13.0–17.0)
MCH: 27.7 pg (ref 26.0–34.0)
MCHC: 30 g/dL (ref 30.0–36.0)
MCV: 92.4 fL (ref 78.0–100.0)
PLATELETS: 147 10*3/uL — AB (ref 150–400)
RBC: 3.82 MIL/uL — ABNORMAL LOW (ref 4.22–5.81)
RDW: 19.5 % — AB (ref 11.5–15.5)
WBC: 15.6 10*3/uL — AB (ref 4.0–10.5)

## 2018-03-14 LAB — BASIC METABOLIC PANEL
Anion gap: 8 (ref 5–15)
BUN: 24 mg/dL — AB (ref 8–23)
CALCIUM: 7.9 mg/dL — AB (ref 8.9–10.3)
CO2: 18 mmol/L — AB (ref 22–32)
CREATININE: 1.36 mg/dL — AB (ref 0.61–1.24)
Chloride: 117 mmol/L — ABNORMAL HIGH (ref 98–111)
GFR calc non Af Amer: 48 mL/min — ABNORMAL LOW (ref >60)
GFR, EST AFRICAN AMERICAN: 55 mL/min — AB (ref >60)
Glucose, Bld: 130 mg/dL — ABNORMAL HIGH (ref 70–99)
Potassium: 4.1 mmol/L (ref 3.5–5.1)
SODIUM: 143 mmol/L (ref 135–145)

## 2018-03-14 MED ORDER — ACETAMINOPHEN 500 MG PO TABS: 1000.0000 mg | ORAL_TABLET | Freq: Three times a day (TID) | ORAL | Status: DC | PRN

## 2018-03-14 MED ORDER — MORPHINE SULFATE (PF) 2 MG/ML IV SOLN
2.0000 mg | INTRAVENOUS | Status: DC | PRN
  Administered 2018-03-14 (×2): 2 mg via INTRAVENOUS
  Filled 2018-03-14 (×2): qty 1

## 2018-03-14 MED ORDER — METRONIDAZOLE IN NACL 5-0.79 MG/ML-% IV SOLN
500.0000 mg | Freq: Three times a day (TID) | INTRAVENOUS | Status: DC
  Administered 2018-03-14 – 2018-03-15 (×3): 500 mg via INTRAVENOUS
  Filled 2018-03-14 (×3): qty 100

## 2018-03-14 MED ORDER — LORAZEPAM 2 MG/ML IJ SOLN: 1.0000 mg | INTRAMUSCULAR | Status: DC | PRN

## 2018-03-14 NOTE — Progress Notes (Signed)
Pt no longer has IV access. Right arm weeping more than left but both upper extremities continue to weep fluid. Molli Hazard, DO notified and IV team consult ordered. Will continue to monitor.

## 2018-03-14 NOTE — Progress Notes (Signed)
DO at bedside. Informed her of pt weeping from extremities with pitting edema 2-3+ and pads under extremities are being changed on average 10 or more times per shift. Also spoke with DO regarding poor oral intake and difficulty to get pt to comply with oral medications except for PO vancomycin (pt will take this without difficulty). Hydrocele also mentioned as well as pt perceived pain. Tylenol given to pt as often as possible but whenever touching the pt or doing pt care he screams and moans stating "it hurts please stop that." DO stated she would decrease his LR to 100 mL/hr and that has been done but restarting fluids were delayed yesterday due to loss of IV access 3 times and due to pt being a difficult IV start IV team had to come back each time in attempt to gain IV access and keep it to start fluids. I was told by DO that she would consult with attending and see what they think. Will continue to monitor.

## 2018-03-14 NOTE — Progress Notes (Signed)
Internal Medicine Attending  Date: 03/14/2018  Patient name: Marquavis Hannen [facility] Medical record number: [id number] Date of birth: 07/12/1941 Age: 77 y.o. Gender: male  I saw and evaluated the patient. I reviewed the resident's note by Dr. Sharon Seller and I agree with the resident's findings and plans as documented in her progress note.  When I saw Mr. Masoner this morning he was lying in bed resting. He was much calmer and more comfortable than what had been described from earlier in the morning. Examination revealed a large inguinal hernia but the clinical situation does not suggest its strangulated or incarcerated. IV access continues to be a problem, as does his continued stool output despite oral vancomycin and IV Flagyl. We are concerned his condition is continuing to deteriorate likely from the C. Difficile colitis that is not seemingly responding to our therapy. Palliative care was consulted and spent considerable time discussing options with family. The family has decided on comfort care and transfer to [facility]. In the meantime, we will try to keep him as comfortable as possible and still provide him with the Flagyl and vancomycin for the C. Difficile colitis. He will likely transition to [facility] as soon as a bed becomes available.  Palliative Care's assistance is greatly appreciated.

## 2018-03-14 NOTE — Progress Notes (Signed)
   Subjective: Pt lying supine in bed and is quieter this morning but moaning occasionally. He states he is not doing ok but was unable to elaborate. He states he is not in any pain but moans when moved and touched.  Nursing states he has skin weeping that requires bandage changes multiple times throughout the day and that he is not eating. Also states he yells and says he is hurting whenever he is moved.   Objective:  Vital signs in last 24 hours: Vitals:   03/13/18 0556 03/13/18 1436 03/13/18 2137 03/14/18 0636  BP: (!) 91/59 107/62 93/67 (!) 103/56  Pulse: 91 93 98 80  Resp: 18 (!) 21 19 19   Temp: 97.7 F (36.5 C) 98.4 F (36.9 C) 98 F (36.7 C)   TempSrc: Oral     SpO2: 95% 100% (!) 89% 92%  Weight:      Height:       Physical Exam: Constitutional: Appears cachectic and ill, disoriented, ocassionally responding to questions, moaning frequently, Pt has difficulty hearing Cardiovascular: RRR, + pedal pulses & UE pulse, LE well perfused, UE cool bilaterally  Respiratory: Breath sounds normal, No respiratory distress, no wheezing, rales, or rhonci  Abdmoninal: Soft, normal BS, Patient moans and grimaces with palpation all four quadrants, no vomiting, +diarrhea GU: large right hydrocele, swelling into inguinal area, TTP, no erythema  MSK: +1 pitting edema LE b/l, +1 pitting edema RUE, weeping from UE bilaterally  Neuro: moaning in response to questions, occasionally will answer, difficult to determine orientation due to baseline dementia and difficult hearing   Assessment/Plan:  Active Problems:   Severe sepsis (HCC)   Pressure injury of skin   Protein-calorie malnutrition, severe   Urinary obstruction   AKI (acute kidney injury) ([place])   Enteritis due to Clostridium difficile   Acute encephalopathy   Dementia with behavioral disturbance  #. C. Difficile:WBC increased to 15.6 from 15.2  -Metronidazole PO switched back to IV as patient is not eating - cont. Vancomycin oral  solution - will need tapering dose - LR 75 mL/hr.   #. Sepsis:Unclear source, initiallysuspected urinary source due to previous history. Initial urine cx was not collected due to traumatic cath.  -d/cCeftriaxone07/04  #Non-anion gap metabolic acidosis, hyperchloremia: continues to have elevated Cl at 117, bicarb 18 - LR 56mL/hr - am BMP  #Right Hydrocele: right scrotum significantly swollen, which was present on admission. possible inguinal hernia as well. Unlikely to be incarcerated as patient continues to have bowel movements, no vomiting - cont. To monitor  #Malnutrition: limited PO intake causing hypoalbuminemia - encouraging PO intake  #Stage II sacral ulcer - pt on air mattress  - wound care consulted - tylenol q8h for pain  #. Hypotension:on 83mL/hr LR - cont. To monitor  #. QTM:AUQJFHLK 0.6, 1.36 today, possibly from no IV fluids yesterday and soft hypotension - LR 38mL/hr  - BMP am   #. TGY:BWLSL to be severe, thickened bladder wall due to outlet obstruction, recently had chronic indwelling catheter complicated by e.coli bacteremia,this was removed by his urologist - start tamsulosin .4mg   -coude catheterin place  #. CAD - cont asa & simvastatin home meds  #. Kidney lesion: hypoechoic lesion superior left pole kidney, seen on previous imaging  - outpt follow up CT or MRI with urology    Dispo: Anticipated discharge in approximately 2 days.   Molli Hazard A, DO 03/14/2018, 9:42 AM Pager: [phone]

## 2018-03-14 NOTE — Consult Note (Signed)
Consultation Note Date: 03/14/2018   Patient Name: Juan Lowe  DOB: [birth year]  MRN: [id number]  Age / Sex: 77 y.o., male  PCP: System, Pcp Not In Referring Physician: Aldine Contes, MD  Reason for Consultation: Establishing goals of care  HPI/Patient Profile: 77 y.o. male  with past medical history of dementia, chronic etoh abuse, FTT, HTN, BPH, CAD, h/o tracheal cancer s/p resection and XRT, AAA, who was recently hospitalized 01/2018 at [facility] for E. Coli bacteremia, and discharged to rehab. He was moved from rehab to ALF in [place] to be near family on 03/05/18 and then was admitted here on 03/06/2018 with sepsis from unclear source. He was also found to have C. Difficile colitis. Patient has clinically declined over the past few days. He has had poor mental status, pain, and has not been eating/drinking. IV fluids have been difficult to maintain due to access issues with weeping extremities and wounds. Palliative care was consulted to help clarify goals.    Clinical Assessment and Goals of Care: Patient seen and examined. He is ill appearing and appears to be uncomfortable. Patient moans with any touch or movement.   I called and had a lengthy conversation with patient's son, Shantel, [place], who is patient's HCPOA. Son has been out of town this week but has been in touch with other family who have been at the hospital. Son updated me on patient's history and decline over the past few months. We discussed patient's current medical problems and probability of future hospitalizations and decline. Family feel that patient would not want to continue with aggressive treatment. The option of comfort care and hospice was explained in detail.  After speaking with his brother and aunts, Wade Asebedo, called me back to request that we transition patient to comfort care and pursue transfer to  [facility]. I called and updated Dr. Sharon Seller regarding the plan. I will consult SW to help with disposition. Comfort medications have been ordered.    SUMMARY OF RECOMMENDATIONS   DNR Comfort Care Start morphine and lorazepam prn D/C all non-comfort meds. However, would continue flagyl and oral vancomycin for now. SW consult to assist with hospice referral      Primary Diagnoses: Present on Admission: . Severe sepsis ([place])   I have reviewed the medical record, interviewed the patient and family, and examined the patient. The following aspects are pertinent.  Past Medical History:  Diagnosis Date  . Cancer ([place])   . Coronary artery disease   . Dementia 2016  . Hypertension   . Tracheal cancer [facility])    Social History   Socioeconomic History  . Marital status: Divorced    Spouse name: Not on file  . Number of children: Not on file  . Years of education: Not on file  . Highest education level: Not on file  Occupational History  . Not on file  Social Needs  . Financial resource strain: Not on file  . Food insecurity:    Worry: Not on file  Inability: Not on file  . Transportation needs:    Medical: Not on file    Non-medical: Not on file  Tobacco Use  . Smoking status: Former Smoker    Types: Cigarettes    Last attempt to quit: 1980    Years since quitting: 39.5  . Smokeless tobacco: Never Used  Substance and Sexual Activity  . Alcohol use: Yes    Alcohol/week: 30.0 oz    Types: 50 Cans of beer per week  . Drug use: No  . Sexual activity: Not Currently  Lifestyle  . Physical activity:    Days per week: Not on file    Minutes per session: Not on file  . Stress: Not on file  Relationships  . Social connections:    Talks on phone: Not on file    Gets together: Not on file    Attends religious service: Not on file    Active member of club or organization: Not on file    Attends meetings of clubs or organizations: Not on file    Relationship status: Not  on file  Other Topics Concern  . Not on file  Social History Narrative  . Not on file   History reviewed. No pertinent family history. Scheduled Meds: . feeding supplement (ENSURE ENLIVE)  237 mL Oral TID BM  . folic acid  1 mg Oral q morning - 10a  . levothyroxine  112.5 mcg Oral QAC breakfast  . multivitamin with minerals  1 tablet Oral Daily  . tamsulosin  0.4 mg Oral QPC breakfast  . vancomycin  125 mg Oral QID   Continuous Infusions: . metronidazole 500 mg (03/14/18 1622)   PRN Meds:.acetaminophen, hydrocortisone, LORazepam, morphine injection Medications Prior to Admission:  Prior to Admission medications   Medication Sig Start Date End Date Taking? Authorizing Provider  aspirin EC 81 MG tablet Take 81 mg by mouth daily.   Yes [provider]  cholecalciferol (VITAMIN D) 1000 UNITS tablet Take 1,000 Units by mouth daily.    Yes [provider]  donepezil (ARICEPT) 10 MG tablet Take 10 mg by mouth every evening.   Yes [provider]  ENSURE (ENSURE) Take 1 Can by mouth 3 (three) times daily.   Yes [provider]  finasteride (PROSCAR) 5 MG tablet Take 5 mg by mouth every evening.   Yes [provider]  folic acid (FOLVITE) 637 MCG tablet Take 400 mcg by mouth daily.    Yes [provider]  levothyroxine (SYNTHROID, LEVOTHROID) 100 MCG tablet Take 100 mcg by mouth daily at 6 (six) AM.   Yes [provider]  Multiple Vitamin (MULTIVITAMIN WITH MINERALS) TABS tablet Take 1 tablet by mouth daily.    Yes [provider]  simvastatin (ZOCOR) 20 MG tablet Take 20 mg by mouth every evening.    Yes [provider]  tamsulosin (FLOMAX) 0.4 MG CAPS capsule Take 0.4 mg by mouth every evening.   Yes [provider]  thiamine (VITAMIN B-1) 100 MG tablet Take 100 mg by mouth daily.   Yes [provider]  vitamin B-12 (CYANOCOBALAMIN) 1000 MCG tablet Take 1,000 mcg by mouth daily.   Yes  [provider]   No Known Allergies Review of Systems  Unable to perform ROS   Physical Exam  Constitutional:  Critically ill appearing  Cardiovascular: Normal rate and regular rhythm.  Pulmonary/Chest: Effort normal and breath sounds normal.  Abdominal: Soft. Bowel sounds are normal.  Neurological:  Lethargic,  restless, does not follow commands  Skin:  Weeping, multiple wounds noted but not visualized    Vital Signs: BP 113/68   Pulse 91   Temp 98 F (36.7 C)   Resp 19   Ht 5\' 5"  (1.651 m)   Wt 38.1 kg (84 lb)   SpO2 92%   BMI 13.98 kg/m  Pain Scale: Faces   Pain Score: Asleep   SpO2: SpO2: 92 % O2 Device:SpO2: 92 % O2 Flow Rate: .O2 Flow Rate (L/min): 3 L/min  IO: Intake/output summary:   Intake/Output Summary (Last 24 hours) at 03/14/2018 1656 Last data filed at 03/14/2018 0645 Gross per 24 hour  Intake -  Output 300 ml  Net -300 ml    LBM: Last BM Date: 03/13/18 Baseline Weight: Weight: 44.9 kg (99 lb) Most recent weight: Weight: 38.1 kg (84 lb)     Palliative Assessment/Data:   Flowsheet Rows     Most Recent Value  Intake Tab  Referral Department  Hospitalist  Unit at Time of Referral  Med/Surg Unit  Date Notified  03/14/18  Palliative Care Type  New Palliative care  Reason for referral  Clarify Goals of Care  Date of Admission  03/06/18  Date first seen by Palliative Care  03/14/18  # of days Palliative referral response time  0 Day(s)  # of days IP prior to Palliative referral  8  Clinical Assessment  Psychosocial & Spiritual Assessment  Social Work Plan of Care  Clarified patient/family wishes with healthcare team  Palliative Care Outcomes  Patient/Family meeting held?  Yes  Who was at the meeting?  son, sisters      Time In: 1600 Time Out: 1700 Time Total: 60 minutes Greater than 50%  of this time was spent counseling and coordinating care related to the above assessment and plan.  Signed by: Irean Hong, NP     Please contact Palliative Medicine Team phone at [phone] for questions and concerns.  For individual provider: See Shea Evans

## 2018-03-15 DIAGNOSIS — I10 Essential (primary) hypertension: Secondary | ICD-10-CM

## 2018-03-15 DIAGNOSIS — K222 Esophageal obstruction: Secondary | ICD-10-CM

## 2018-03-15 DIAGNOSIS — Z87440 Personal history of urinary (tract) infections: Secondary | ICD-10-CM

## 2018-03-15 DIAGNOSIS — Z8512 Personal history of malignant neoplasm of trachea: Secondary | ICD-10-CM

## 2018-03-15 DIAGNOSIS — K209 Esophagitis, unspecified: Secondary | ICD-10-CM

## 2018-03-15 DIAGNOSIS — Z923 Personal history of irradiation: Secondary | ICD-10-CM

## 2018-03-15 MED ORDER — VANCOMYCIN 50 MG/ML ORAL SOLUTION: 125.0000 mg | Freq: Four times a day (QID) | ORAL | 0 refills | Status: AC

## 2018-03-15 MED ORDER — LORAZEPAM 2 MG/ML IJ SOLN: 1.0000 mg | INTRAMUSCULAR | 0 refills | Status: AC | PRN

## 2018-03-15 MED ORDER — METRONIDAZOLE IN NACL 5-0.79 MG/ML-% IV SOLN: 500.0000 mg | Freq: Three times a day (TID) | INTRAVENOUS | 0 refills | Status: AC

## 2018-03-15 MED ORDER — MORPHINE SULFATE (PF) 2 MG/ML IV SOLN: 2.0000 mg | INTRAVENOUS | 0 refills | Status: AC | PRN

## 2018-03-15 MED ORDER — HYDROCORTISONE 2.5 % RE CREA: TOPICAL_CREAM | Freq: Two times a day (BID) | RECTAL | 0 refills | Status: AC | PRN

## 2018-03-15 MED ORDER — ENSURE ENLIVE PO LIQD: 237.0000 mL | Freq: Three times a day (TID) | ORAL | 12 refills | Status: AC

## 2018-03-15 NOTE — Progress Notes (Signed)
Patient will DC to: [facility] Anticipated DC date: 03/15/18 Family notified: Son, Thamas Jaegers Transport by: Corey Harold   Per MD patient ready for DC to [facility]. RN, patient, patient's family, and facility notified of DC. Discharge Summary sent to facility. RN given number for report [phone]). DC packet on chart. Ambulance transport requested for patient.   CSW signing off.  Cedric Fishman, LCSW Clinical Social Worker [phone]

## 2018-03-15 NOTE — Progress Notes (Signed)
   Subjective: Juan Lowe sleeping on visit today and breathing normally. This is the first occurrence I have not seen him agitated. Our team did not wake him as he is on comfort care but did perform a physical exam.  Juan Lowe son returned from his trip yesterday and was at bedside. We discussed his father's illness, and the family's decision for comfort care.   Objective:  Vital signs in last 24 hours: Vitals:   03/14/18 1800 03/14/18 2108 03/15/18 0000 03/15/18 0640  BP: (!) 103/56 104/66  101/65  Pulse: 80 88  93  Resp: 18 19  18   Temp:  (!) 97.4 F (36.3 C) (!) 97.5 F (36.4 C) (!) 97.5 F (36.4 C)  TempSrc:  Axillary Axillary   SpO2: 98% 95%  (!) 72%  Weight:      Height:       Physical Exam  Physical Exam   Constitutional: appears cachectic, ill. Lying supine and asleep in bed  HENT: normocephalic, atraumatic Cardiovascular: RRR Respiratory: CTAB, non-labored breathing Abdominal: two loose stools overnight, +BS GU: large right hydrocele with swelling in inguinal area MSK: UE cool bilaterally, +radial pulses Skin: weeping of serous fluid from UE bilaterally, skin fragile and parchment like with some areas of breakdown    Assessment/Plan:  Active Problems:   Severe sepsis (HCC)   Pressure injury of skin   Protein-calorie malnutrition, severe   Urinary obstruction   AKI (acute kidney injury) ([place])   Enteritis due to Clostridium difficile   Acute encephalopathy   Dementia with behavioral disturbance   Palliative care encounter   Patient's family visited with palliative last night to discuss goals of care with ultimate decision to switch Juan Lowe to comfort care. He will be discharged to [facility].   #. C. Difficile:2 loose stools overnight.  -cont. IV metronidazole - cont. Vancomycin oral solution - saline lock IV - morphine 2mg  q2h prn  #. Sepsis:Unclear source, initiallysuspected urinary source due to previous history. Initial urine cx  unable to be collected before antibiotic initiation, unremarkable cx.  -d/cCeftriaxone07/04  #Non-anion gap metabolic acidosis, hyperchloremia: continues to have elevated Cl at 117, bicarb 18 - encouraging oral rehydration  #Right Hydrocele: right scrotum significantly swollen, which was present on admission. possible inguinal hernia as well. Unlikely to be incarcerated as patient continues to have bowel movements, no vomiting - cont. To monitor  #Malnutrition: limited PO intake causing hypoalbuminemia - encouraging PO intake  #Stage II sacral ulcer - pt on air mattress  - wound care consulted - tylenol q8h for pain - morphine 2mg  q2h for pain, ativan  #. Hypotension: - switched to comfort care  #. RCV:ELFYBOFB 0.6, 1.36 yesterday - encourage oral rehydration  #. PZW:CHENI to be severe, thickened bladder wall due to outlet obstruction, recently had chronic indwelling catheter complicated by e.coli bacteremia,this was removed by his urologist -cont tamsulosin .4mg  -coude catheterin place  #. CAD - home meds asa & lipitor - home meds discontinued  #. Kidney lesion: hypoechoic lesion superior left pole kidney, seen on previous imaging     Dispo: Anticipated discharge in today.  Veeda Virgo A, DO 03/15/2018, 11:46 AM Pager: [phone]

## 2018-03-15 NOTE — Progress Notes (Signed)
Internal Medicine Attending  Date: 03/15/2018  Patient name: Juan Lowe [facility] Medical record number: [id number] Date of birth: [birth year] Age: 77 y.o. Gender: male  I saw and evaluated the patient. I reviewed the resident's note by Dr. Sharon Seller and I agree with the resident's findings and plans as documented in her progress note.  When seen on rounds this morning Juan Lowe was resting comfortably. We spoke to his son about his current situation. The family has elected residential hospice and he is being transferred to [facility] today.

## 2018-03-15 NOTE — Progress Notes (Signed)
Report called to nurse Arbie Cookey at [facility]. Informed her of pt extremities weeping and dressings changed this morning but I will recheck to make sure all dressings are clean/dry/intact before transportation to facility. I also asked nurse if she wanted to leave IV access in place upon transfer due to pt being a very difficult stick and she stated that was fine to leave for now and that they could remove it at the facility if necessary. Family updated and awaiting transportation.

## 2018-03-15 NOTE — Progress Notes (Signed)
[facility] Liaison RN note.  Received request from Cedric Fishman, [place] for family interest in [facility] with request for transfer today. Chart reviewed and patient accepted to [facility]. Met with son, Johney to confirm interest and explain services. Family agreeable to transfer today. CSW aware.  Registration paper work completed. Dr. Orpah Melter  to assume care per family request. Please fax discharge summary to [phone]. RN please call report to [phone]. Please arrange transport for patient to arrive before noon if possible.  Thank you,   Marquette Blodgett, RN, [facility] Liaison  [place] are on AMION

## 2018-03-15 NOTE — Progress Notes (Signed)
10am-[facility] able to accept patient today.  9am-CSW received consult regarding residential Hospice placement. CSW sent referral to [facility] for review.  Percell Locus Sherine Cortese LCSW [phone]

## 2018-04-08 DEATH — deceased

## 2019-04-17 IMAGING — CT CT CERVICAL SPINE W/O CM
3 of 9 series · 8 of 33 positions shown, 9 images · non-contrast
Comparison: None.

CLINICAL DATA: Fall

EXAM:
CT HEAD WITHOUT CONTRAST
CT CERVICAL SPINE WITHOUT CONTRAST
TECHNIQUE: Multidetector CT imaging of the head and cervical spine was
performed following the standard protocol without intravenous
contrast. Multiplanar CT image reconstructions of the cervical spine
were also generated.

[Series 4: c_spine 2.0 st · axial · 0.32mm/px · z∈[-247,-197]mm · 2 of 76 slices shown, 3 images]
[im 26/76  soft-tissue]
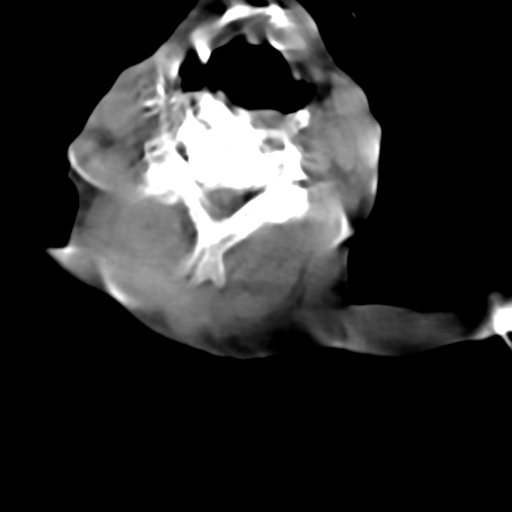
[im 26/76  bone]
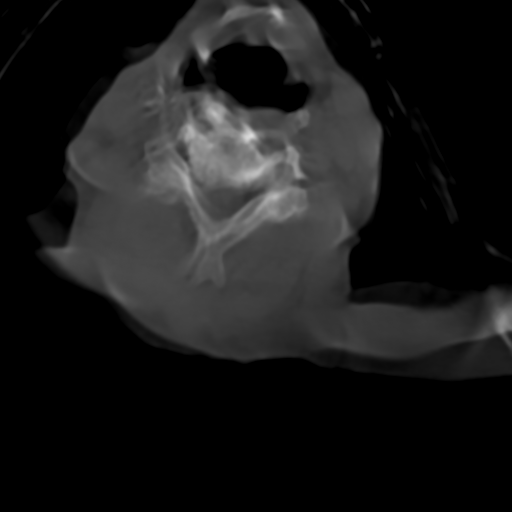
[im 51/76  bone]
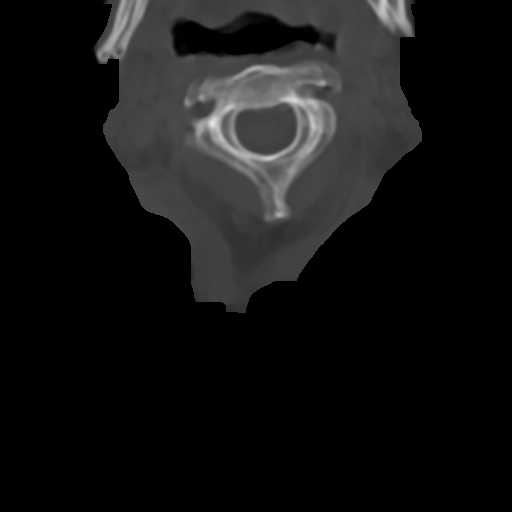

[Series 14: c_spine 2.0 sag bone · sagittal · 0.27mm/px · 5 of 53 slices shown]
[im 9/53  bone]
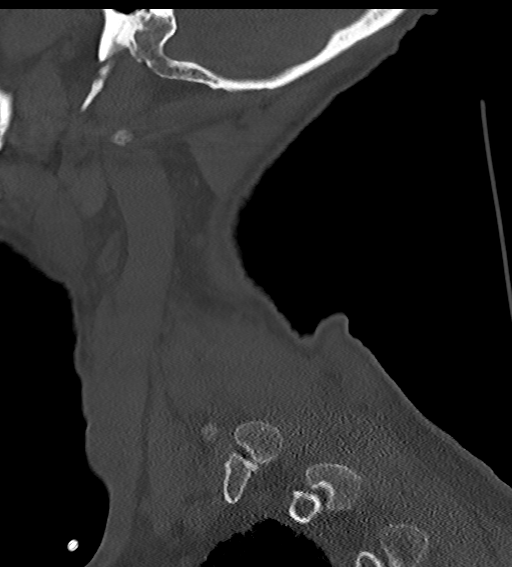
[im 18/53  bone]
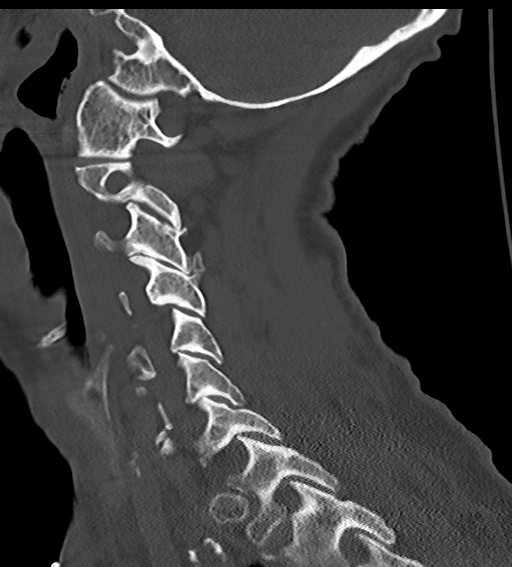
[im 27/53  bone]
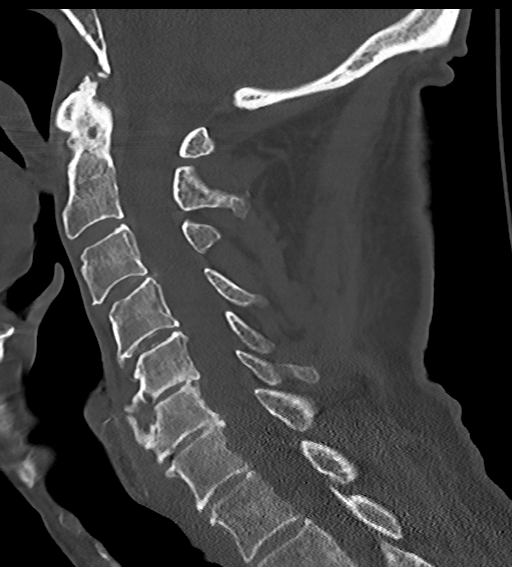
[im 35/53  bone]
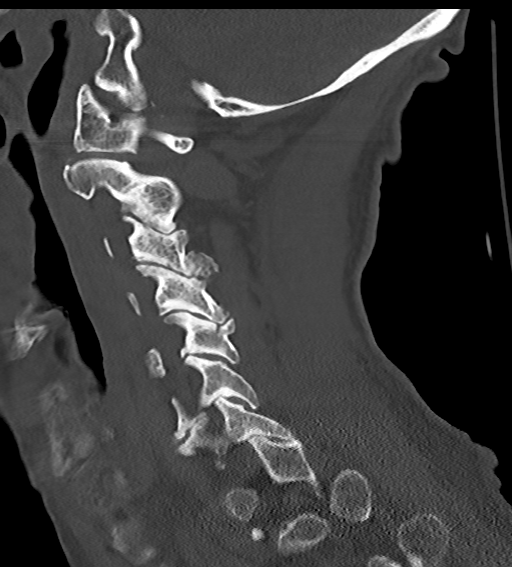
[im 44/53  bone]
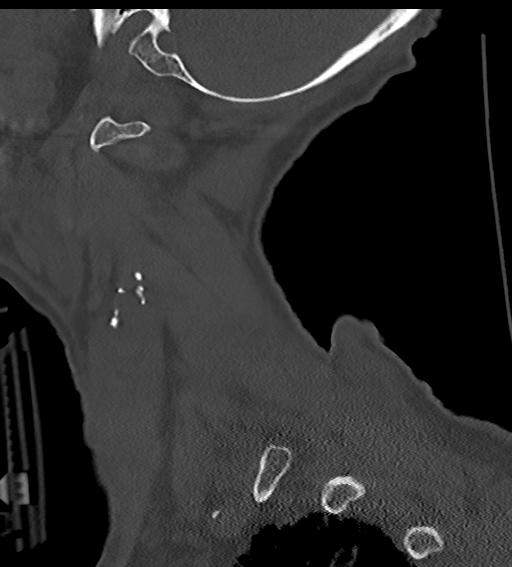

[Series 15: c_spine 2.0 cor bone · coronal · 0.28mm/px · 1 of 48 slices shown]
[im 24/48  bone]
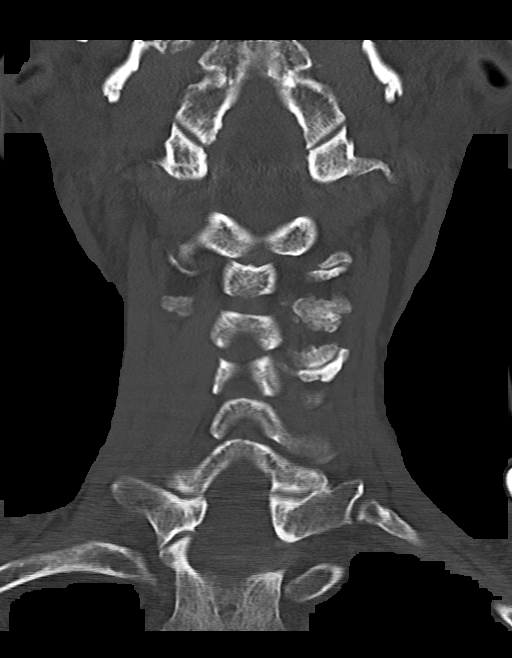

[8 of 33 positions shown; findings below may reference images not displayed]

FINDINGS: CT HEAD FINDINGS

Brain: There is no mass, hemorrhage or extra-axial collection. There
is generalized atrophy without lobar predilection. Old left basal
ganglia lacunar infarct. There is hypoattenuation of the
periventricular white matter, most commonly indicating chronic
ischemic microangiopathy.

Vascular: No abnormal hyperdensity of the major intracranial
arteries or dural venous sinuses. No intracranial atherosclerosis.

Skull: The visualized skull base, calvarium and extracranial soft
tissues are normal.

Sinuses/Orbits: No fluid levels or advanced mucosal thickening of
the visualized paranasal sinuses. No mastoid or middle ear effusion.
The orbits are normal.

CT CERVICAL SPINE FINDINGS

Alignment: No static subluxation. Facets are aligned. Occipital
condyles are normally positioned.

Skull base and vertebrae: No acute fracture.

Soft tissues and spinal canal: No prevertebral fluid or swelling. No
visible canal hematoma.

Disc levels: There is multilevel severe facet hypertrophy with bulky
anterior osteophytes at C4-C6. No spinal canal stenosis. There is
severe neural foraminal stenosis at right C5-6 and left C3-4.

Upper chest: No pneumothorax, pulmonary nodule or pleural effusion.

Other: Normal visualized paraspinal cervical soft tissues.
IMPRESSION: 1. No acute intracranial abnormality.
2. Chronic small vessel disease and generalized brain volume loss.
3. No acute fracture or static subluxation of the cervical spine.
4. Multilevel degenerative disc and facet disease with severe neural
foraminal stenosis at left C3-4 and right C5-6.
